# Patient Record
Sex: Female | Born: 1980 | Race: Black or African American | Hispanic: No | Marital: Single | Smoking: Never smoker
Health system: Southern US, Community
[De-identification: ages and names within clinical notes are randomized; demographics above are authoritative.]

## PROBLEM LIST (undated history)

## (undated) ENCOUNTER — Inpatient Hospital Stay (HOSPITAL_COMMUNITY): Payer: Self-pay

## (undated) DIAGNOSIS — IMO0002 Reserved for concepts with insufficient information to code with codable children: Secondary | ICD-10-CM

## (undated) DIAGNOSIS — D649 Anemia, unspecified: Secondary | ICD-10-CM

## (undated) HISTORY — PX: WISDOM TOOTH EXTRACTION: SHX21

## (undated) HISTORY — DX: Reserved for concepts with insufficient information to code with codable children: IMO0002

---

## 2003-08-10 ENCOUNTER — Emergency Department (HOSPITAL_COMMUNITY): Admission: EM | Admit: 2003-08-10 | Discharge: 2003-08-11 | Payer: Self-pay | Admitting: Emergency Medicine

## 2005-02-17 ENCOUNTER — Emergency Department (HOSPITAL_COMMUNITY): Admission: EM | Admit: 2005-02-17 | Discharge: 2005-02-17 | Payer: Self-pay | Admitting: Emergency Medicine

## 2005-07-12 ENCOUNTER — Inpatient Hospital Stay (HOSPITAL_COMMUNITY): Admission: AD | Admit: 2005-07-12 | Discharge: 2005-07-12 | Payer: Self-pay | Admitting: Obstetrics and Gynecology

## 2005-07-13 ENCOUNTER — Inpatient Hospital Stay (HOSPITAL_COMMUNITY): Admission: AD | Admit: 2005-07-13 | Discharge: 2005-07-13 | Payer: Self-pay | Admitting: Obstetrics and Gynecology

## 2005-09-13 ENCOUNTER — Inpatient Hospital Stay (HOSPITAL_COMMUNITY): Admission: AD | Admit: 2005-09-13 | Discharge: 2005-09-14 | Payer: Self-pay | Admitting: Obstetrics & Gynecology

## 2005-09-26 ENCOUNTER — Inpatient Hospital Stay (HOSPITAL_COMMUNITY): Admission: AD | Admit: 2005-09-26 | Discharge: 2005-09-26 | Payer: Self-pay | Admitting: Obstetrics & Gynecology

## 2005-09-26 ENCOUNTER — Inpatient Hospital Stay (HOSPITAL_COMMUNITY): Admission: AD | Admit: 2005-09-26 | Discharge: 2005-09-29 | Payer: Self-pay | Admitting: Obstetrics & Gynecology

## 2006-04-22 ENCOUNTER — Inpatient Hospital Stay (HOSPITAL_COMMUNITY): Admission: AD | Admit: 2006-04-22 | Discharge: 2006-04-22 | Payer: Self-pay | Admitting: Obstetrics and Gynecology

## 2006-11-15 IMAGING — US US OB COMP LESS 14 WK
1 series · 14 of 28 positions shown · non-contrast
Comparison: none

CLINICAL DATA: Positive urine pregnancy test. Right flank and back pain.

OB ULTRASOUND, LESS THAN 14 WEEKS, TRANSABDOMINAL AND TRANSVAGINAL:

[Series 1: unknown · 0.27mm/px · 14 of 47 slices shown]
[im 2/47]
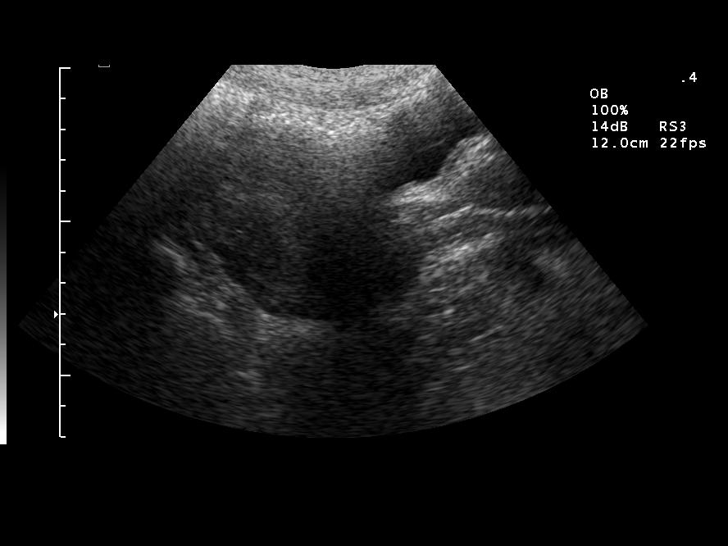
[im 6/47]
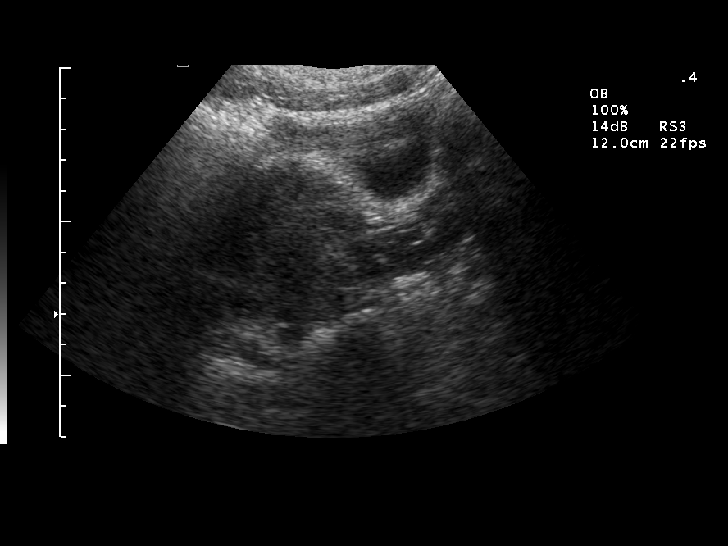
[im 9/47]
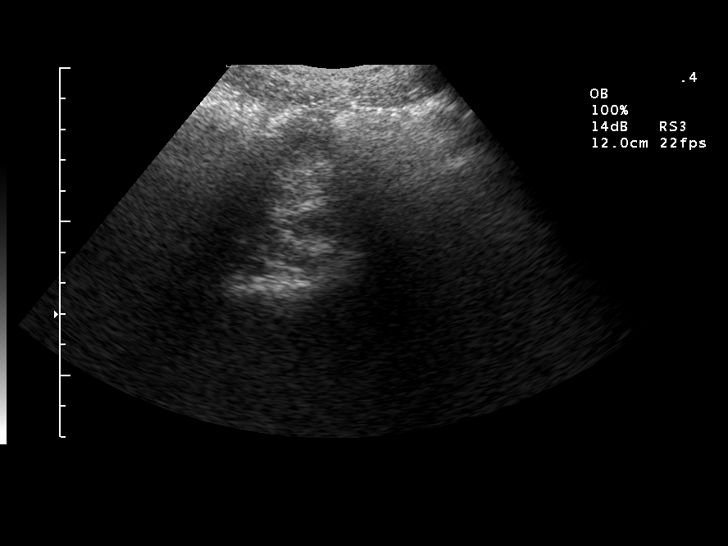
[im 12/47]
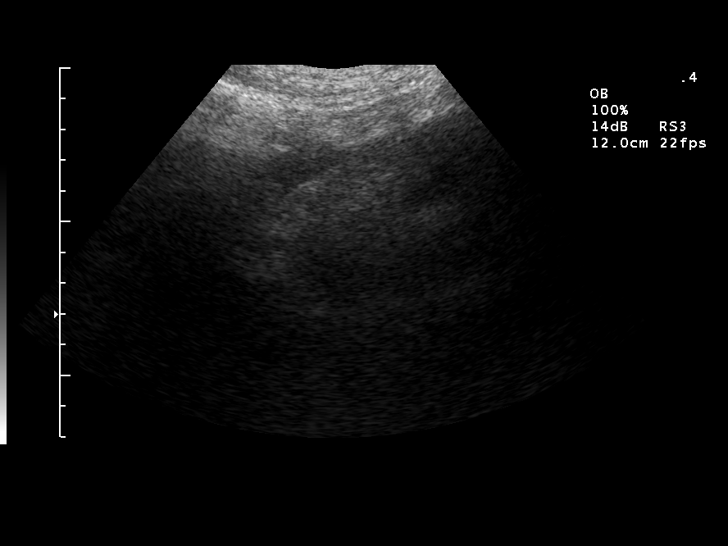
[im 16/47]
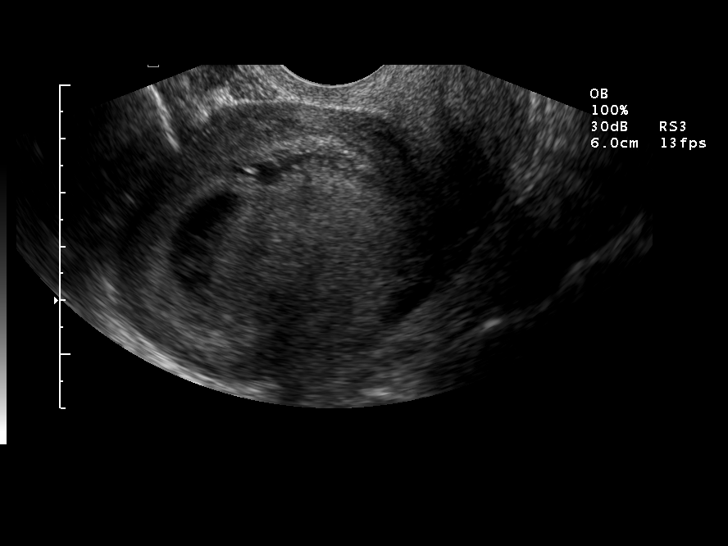
[im 19/47]
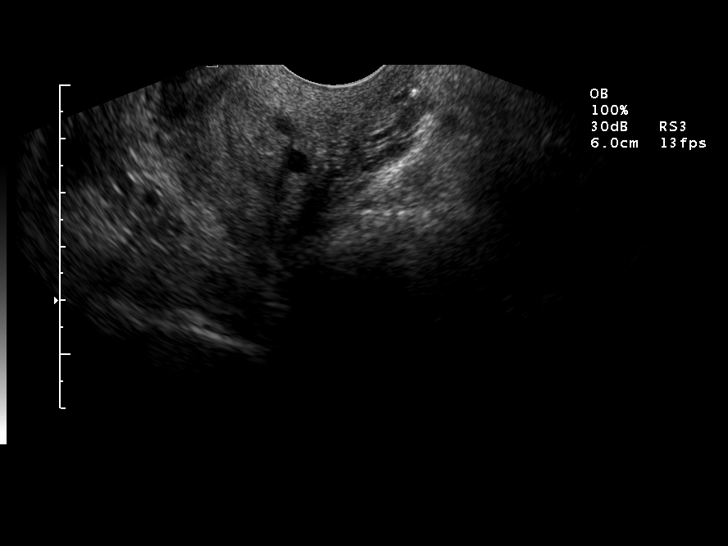
[im 23/47]
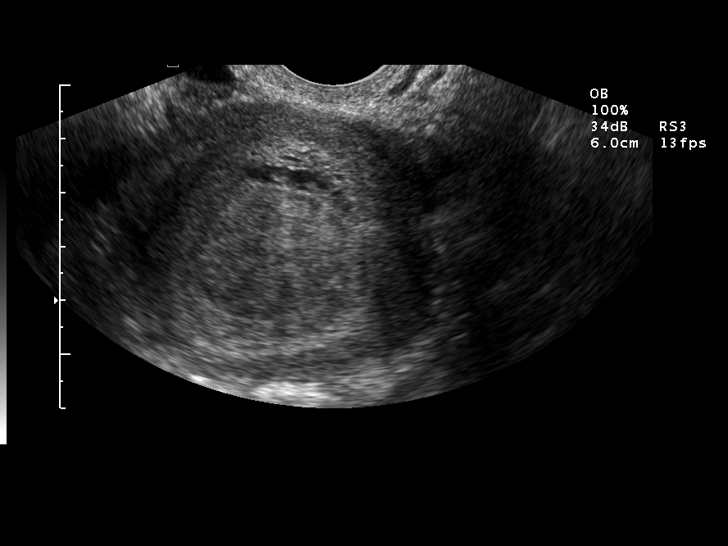
[im 26/47]
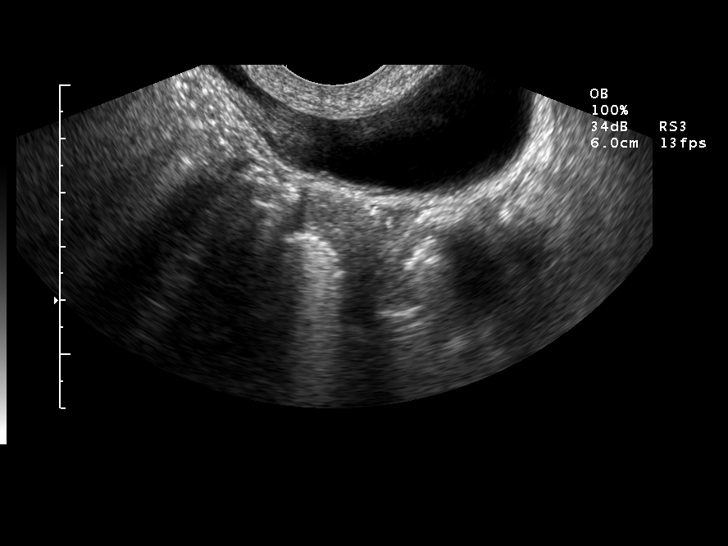
[im 29/47]
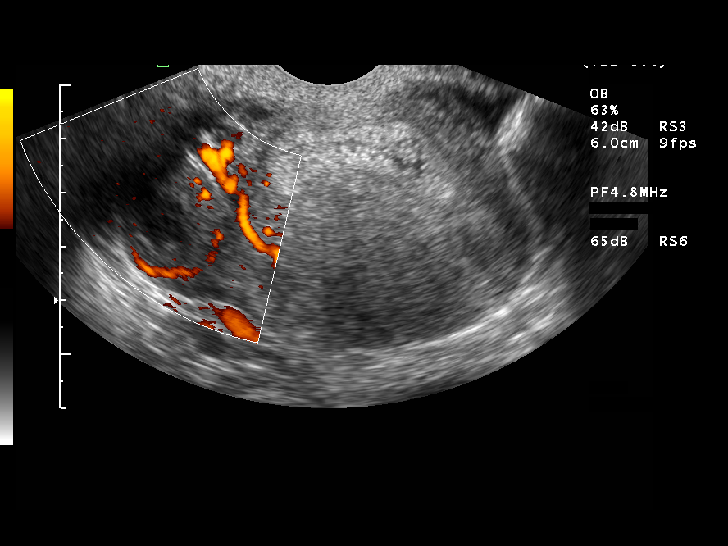
[im 33/47]
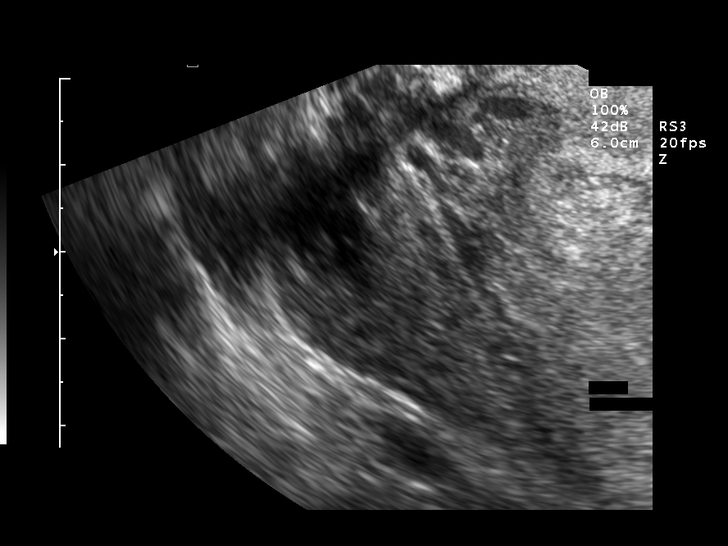
[im 36/47]
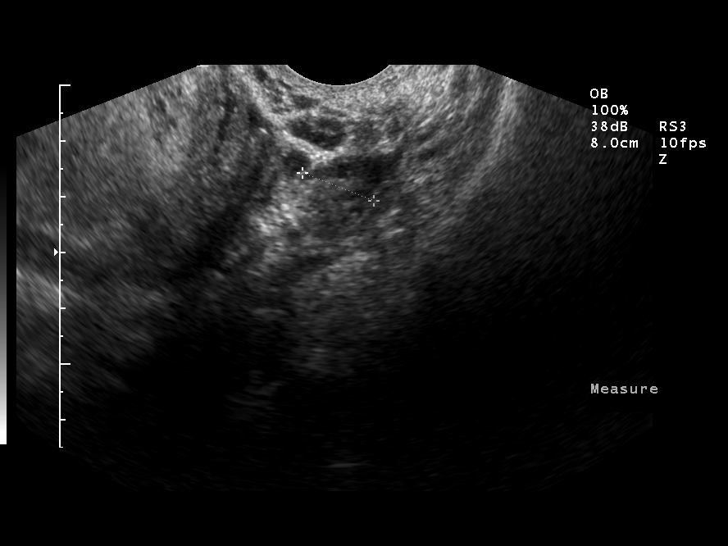
[im 40/47]
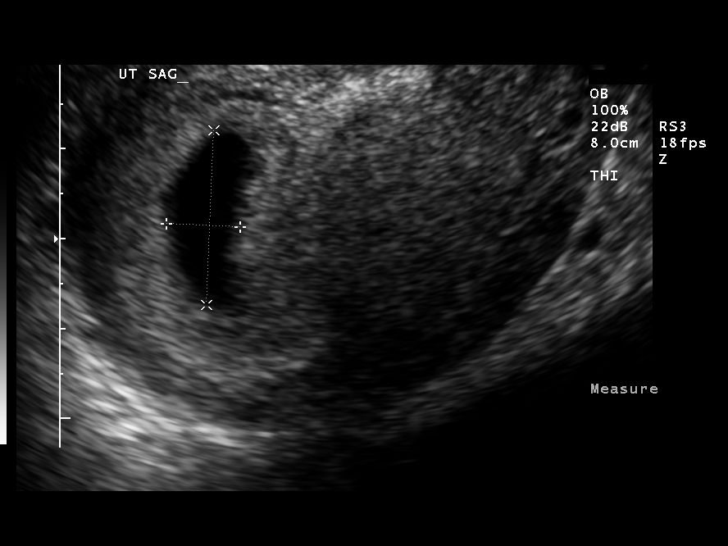
[im 43/47]
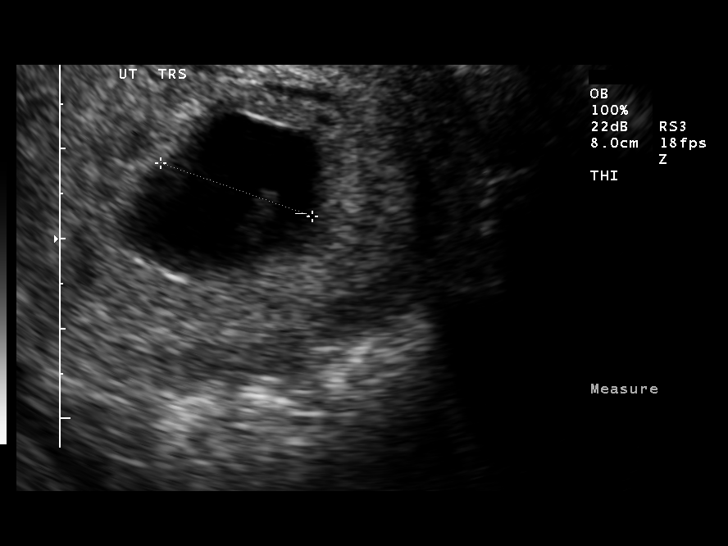
[im 47/47]
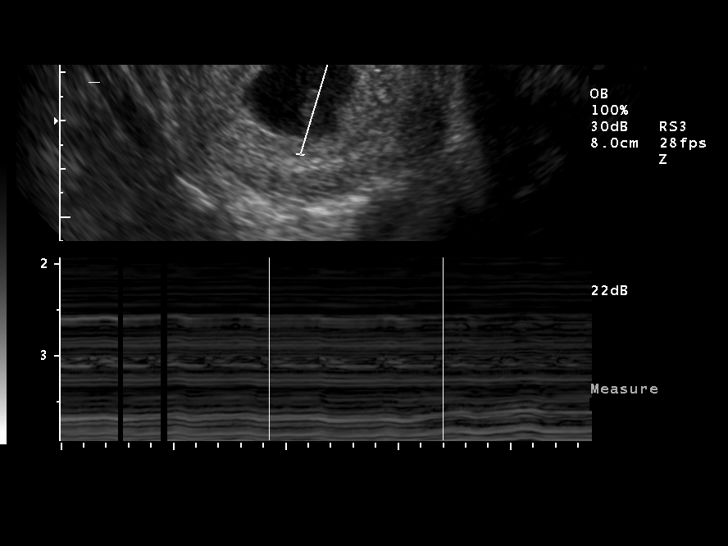

[14 of 28 positions shown; findings below may reference images not displayed]

FINDINGS: There is an intrauterine gestation. Yolk sac and fetal pole are
present. Based on crown-rump length, estimated gestational age 6 weeks 3 days.
Fetal heart rate 118 beats per minute. No subchorionic hemorrhage. Small corpus
luteum cyst noted in the right ovary. Left ovary unremarkable. Small amount of
free fluid.
IMPRESSION: Early intrauterine pregnancy, 6 weeks 3 days based on crown-rump length. Fetal
heart rate 118 beats per minute.

## 2006-12-04 ENCOUNTER — Inpatient Hospital Stay (HOSPITAL_COMMUNITY): Admission: AD | Admit: 2006-12-04 | Discharge: 2006-12-04 | Payer: Self-pay | Admitting: Obstetrics and Gynecology

## 2006-12-06 ENCOUNTER — Inpatient Hospital Stay (HOSPITAL_COMMUNITY): Admission: AD | Admit: 2006-12-06 | Discharge: 2006-12-08 | Payer: Self-pay | Admitting: Obstetrics and Gynecology

## 2007-01-22 ENCOUNTER — Other Ambulatory Visit: Admission: RE | Admit: 2007-01-22 | Discharge: 2007-01-22 | Payer: Self-pay | Admitting: Obstetrics and Gynecology

## 2008-08-29 ENCOUNTER — Inpatient Hospital Stay (HOSPITAL_COMMUNITY): Admission: AD | Admit: 2008-08-29 | Discharge: 2008-08-29 | Payer: Self-pay | Admitting: Obstetrics & Gynecology

## 2008-11-18 ENCOUNTER — Inpatient Hospital Stay (HOSPITAL_COMMUNITY): Admission: AD | Admit: 2008-11-18 | Discharge: 2008-11-18 | Payer: Self-pay | Admitting: Obstetrics & Gynecology

## 2008-11-23 ENCOUNTER — Inpatient Hospital Stay (HOSPITAL_COMMUNITY): Admission: AD | Admit: 2008-11-23 | Discharge: 2008-11-23 | Payer: Self-pay | Admitting: Obstetrics & Gynecology

## 2008-12-31 ENCOUNTER — Inpatient Hospital Stay (HOSPITAL_COMMUNITY): Admission: AD | Admit: 2008-12-31 | Discharge: 2009-01-01 | Payer: Self-pay | Admitting: Obstetrics and Gynecology

## 2009-01-07 ENCOUNTER — Inpatient Hospital Stay (HOSPITAL_COMMUNITY): Admission: AD | Admit: 2009-01-07 | Discharge: 2009-01-07 | Payer: Self-pay | Admitting: Obstetrics & Gynecology

## 2009-01-21 ENCOUNTER — Inpatient Hospital Stay (HOSPITAL_COMMUNITY): Admission: AD | Admit: 2009-01-21 | Discharge: 2009-01-21 | Payer: Self-pay | Admitting: Obstetrics & Gynecology

## 2009-01-31 ENCOUNTER — Inpatient Hospital Stay (HOSPITAL_COMMUNITY): Admission: AD | Admit: 2009-01-31 | Discharge: 2009-02-02 | Payer: Self-pay | Admitting: Obstetrics and Gynecology

## 2010-01-16 ENCOUNTER — Emergency Department (HOSPITAL_BASED_OUTPATIENT_CLINIC_OR_DEPARTMENT_OTHER): Admission: EM | Admit: 2010-01-16 | Discharge: 2010-01-16 | Payer: Self-pay | Admitting: Emergency Medicine

## 2010-09-28 IMAGING — US US FETAL BPP W/O NONSTRESS
1 series · 8 of 8 positions shown · non-contrast
Comparison: none

OBSTETRICAL ULTRASOUND:
 This ultrasound exam was performed in the [HOSPITAL] Ultrasound Department.  The OB US report was generated in the AS system, and faxed to the ordering physician.  This report is also available in [REDACTED] PACS.

[Series 1: us fetal bpp w/o nonstress · non-contrast · 0.33mm/px · 8 acquisitions, 8 frames shown]
[im 1/8]
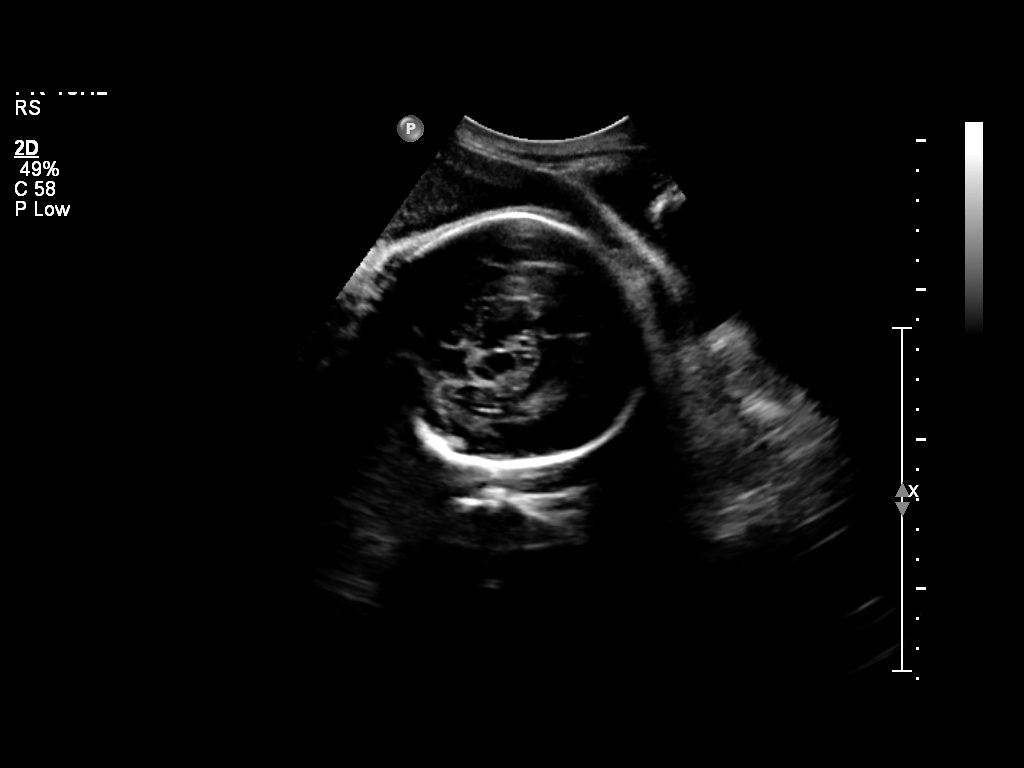
[im 2/8]
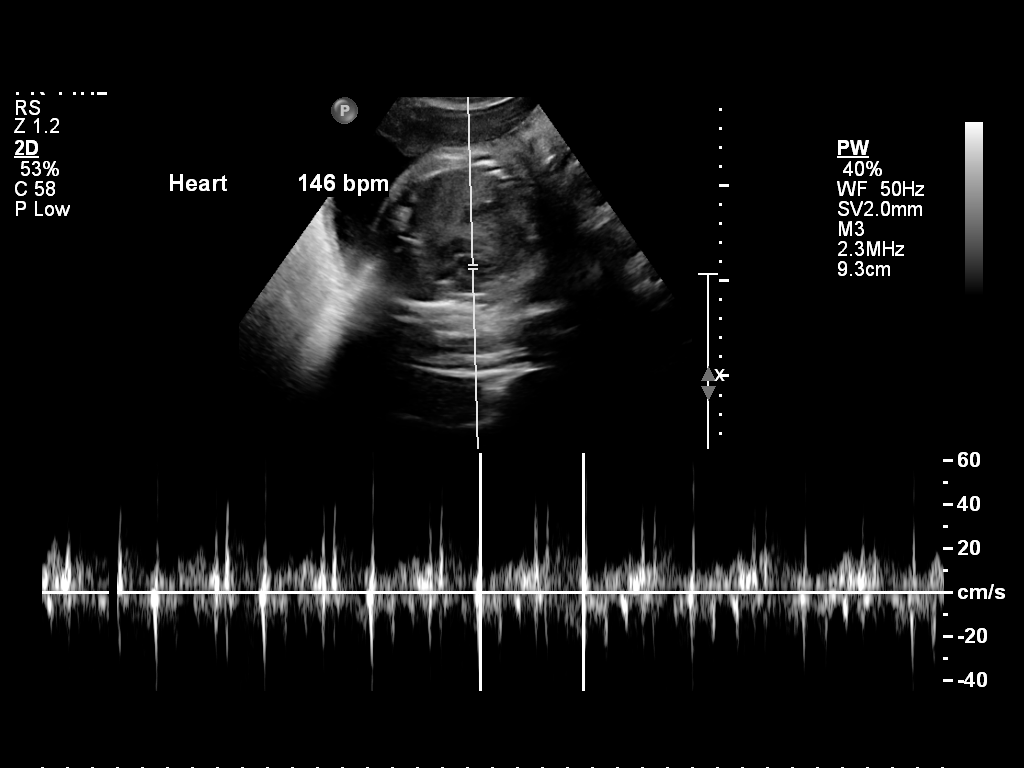
[im 3/8]
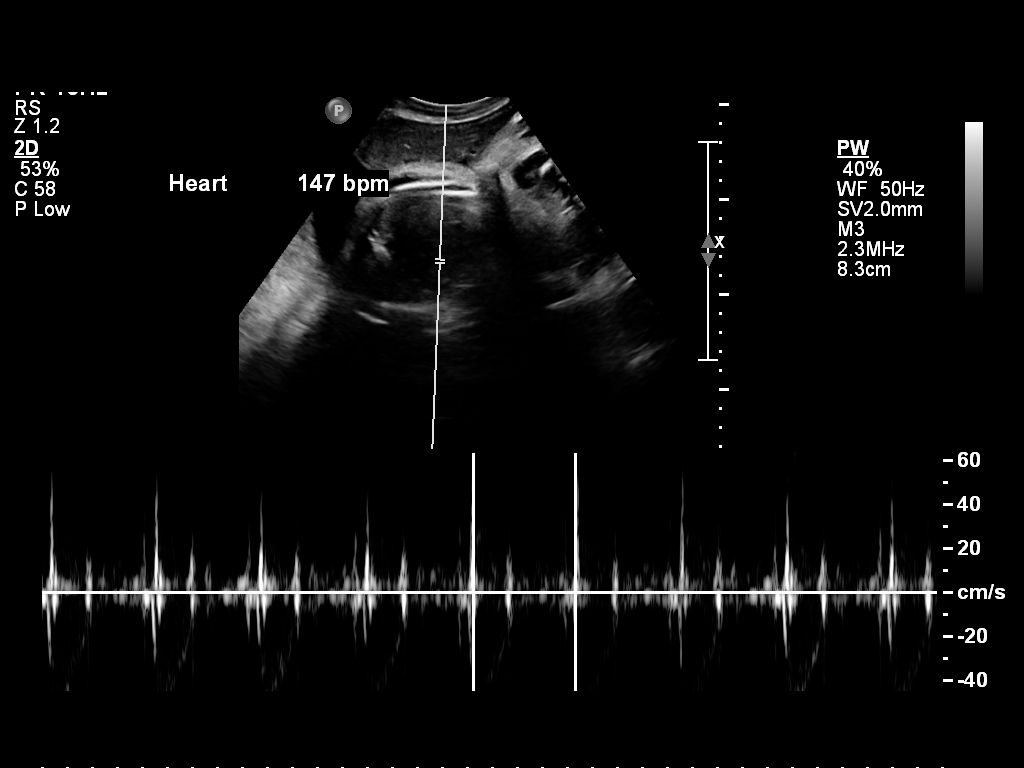
[im 4/8]
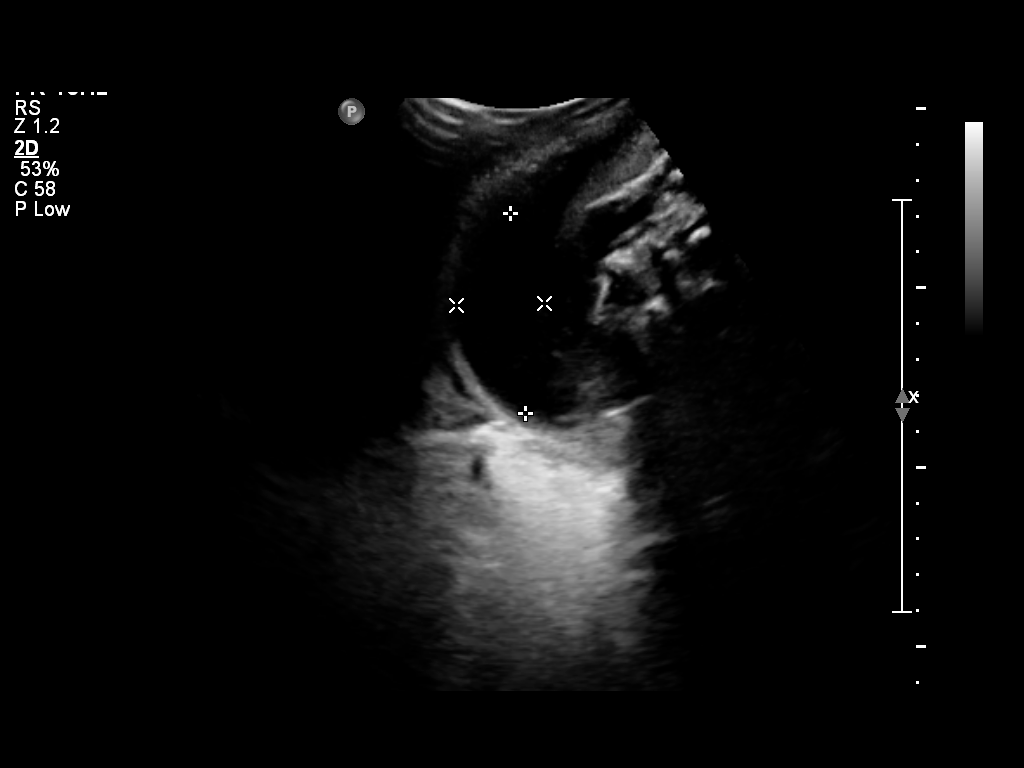
[im 5/8]
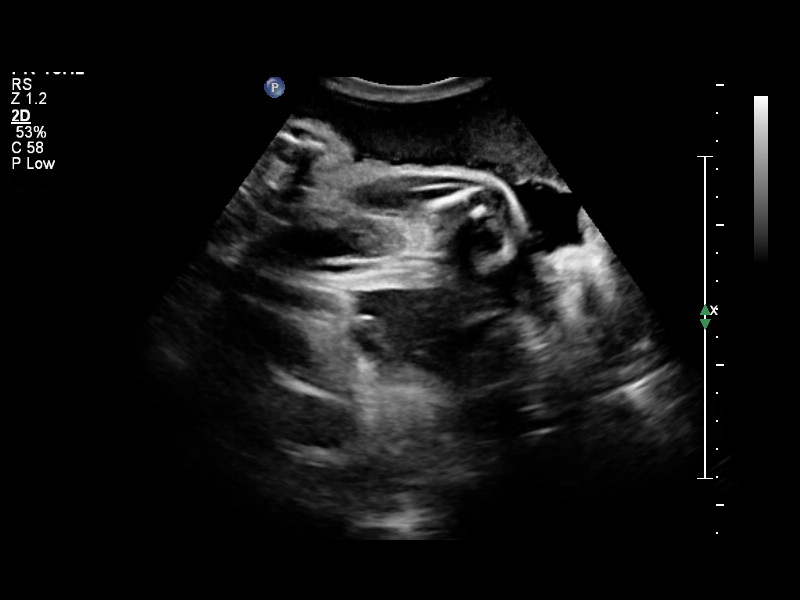
[im 6/8]
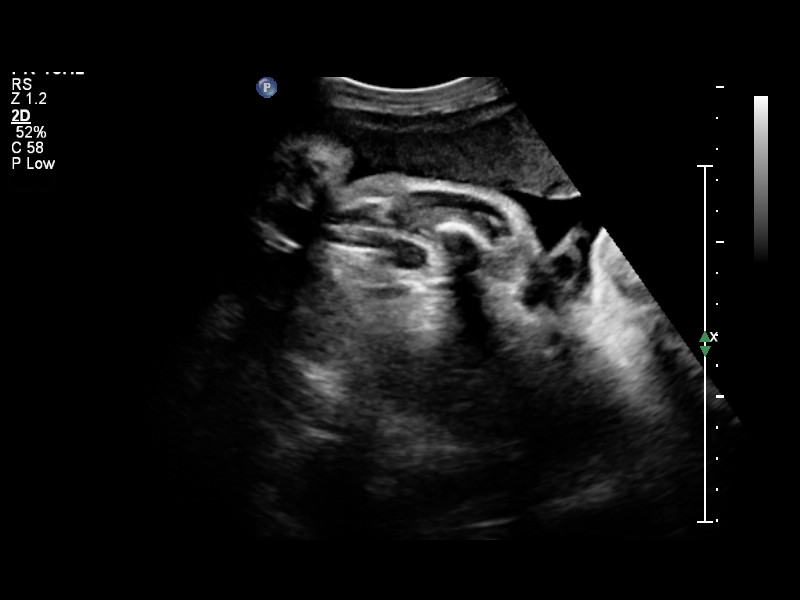
[im 7/8]
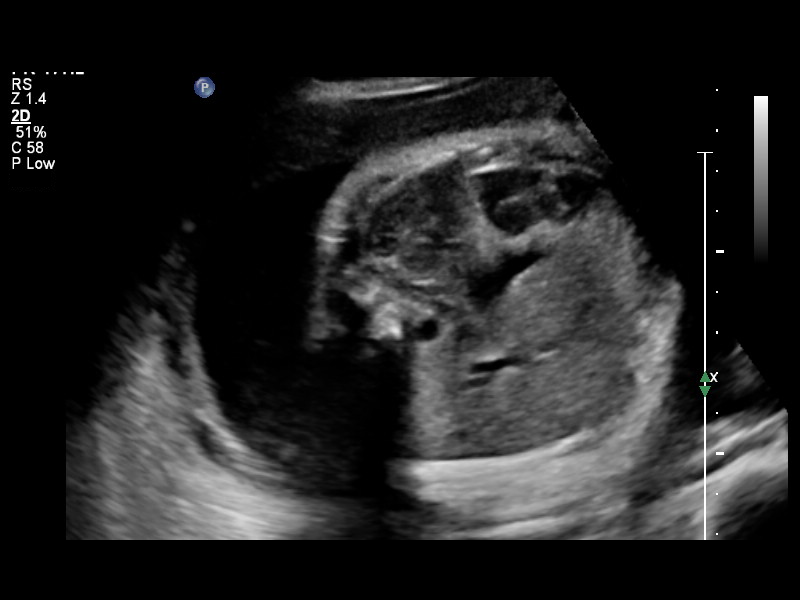
[im 8/8]
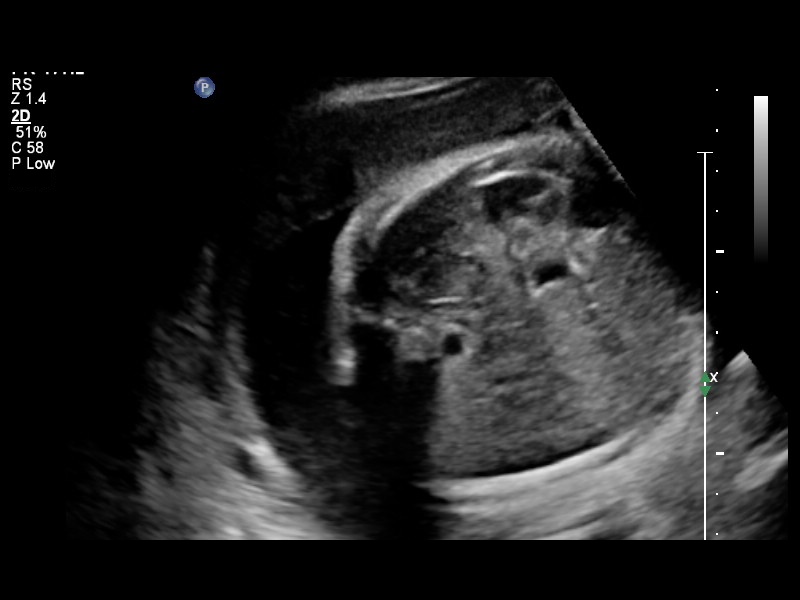

[8 of 8 positions shown; findings below may reference images not displayed]

IMPRESSION: See AS Obstetric US report.

## 2010-10-30 LAB — STREP A DNA PROBE: Group A Strep Probe: NEGATIVE

## 2010-11-12 DIAGNOSIS — IMO0002 Reserved for concepts with insufficient information to code with codable children: Secondary | ICD-10-CM

## 2010-11-12 DIAGNOSIS — R87619 Unspecified abnormal cytological findings in specimens from cervix uteri: Secondary | ICD-10-CM

## 2010-11-12 HISTORY — DX: Reserved for concepts with insufficient information to code with codable children: IMO0002

## 2010-11-12 HISTORY — DX: Unspecified abnormal cytological findings in specimens from cervix uteri: R87.619

## 2010-11-20 LAB — CBC
Hemoglobin: 12.1 g/dL (ref 12.0–15.0)
MCHC: 33.4 g/dL (ref 30.0–36.0)
MCHC: 34.1 g/dL (ref 30.0–36.0)
MCV: 95.3 fL (ref 78.0–100.0)
Platelets: 191 10*3/uL (ref 150–400)
WBC: 13.4 10*3/uL — ABNORMAL HIGH (ref 4.0–10.5)

## 2010-11-20 LAB — CCBB MATERNAL DONOR DRAW

## 2010-11-21 LAB — STREP B DNA PROBE: Strep Group B Ag: NEGATIVE

## 2010-11-22 LAB — URINALYSIS, ROUTINE W REFLEX MICROSCOPIC
Bilirubin Urine: NEGATIVE
Glucose, UA: NEGATIVE mg/dL
Ketones, ur: NEGATIVE mg/dL
Nitrite: NEGATIVE
Protein, ur: NEGATIVE mg/dL
pH: 5.5 (ref 5.0–8.0)

## 2010-11-22 LAB — FETAL FIBRONECTIN
Fetal Fibronectin: NEGATIVE
Fetal Fibronectin: NEGATIVE

## 2010-11-22 LAB — GC/CHLAMYDIA PROBE AMP, GENITAL
Chlamydia, DNA Probe: NEGATIVE
GC Probe Amp, Genital: NEGATIVE

## 2010-11-22 LAB — WET PREP, GENITAL: Yeast Wet Prep HPF POC: NONE SEEN

## 2010-11-27 LAB — CBC
MCV: 92.8 fL (ref 78.0–100.0)
Platelets: 247 10*3/uL (ref 150–400)
RBC: 3.47 MIL/uL — ABNORMAL LOW (ref 3.87–5.11)
WBC: 8.5 10*3/uL (ref 4.0–10.5)

## 2010-11-27 LAB — WET PREP, GENITAL: Trich, Wet Prep: NONE SEEN

## 2010-11-27 LAB — URINALYSIS, ROUTINE W REFLEX MICROSCOPIC
Bilirubin Urine: NEGATIVE
Hgb urine dipstick: NEGATIVE
Ketones, ur: 40 mg/dL — AB
Nitrite: NEGATIVE
Protein, ur: NEGATIVE mg/dL
Specific Gravity, Urine: <1.005 — ABNORMAL LOW (ref 1.005–1.030)
Urobilinogen, UA: 0.2 mg/dL (ref 0.0–1.0)
pH: 6.5 (ref 5.0–8.0)

## 2010-11-27 LAB — POCT PREGNANCY, URINE: Preg Test, Ur: POSITIVE

## 2010-11-27 LAB — GC/CHLAMYDIA PROBE AMP, GENITAL: Chlamydia, DNA Probe: NEGATIVE

## 2010-12-26 NOTE — H&P (Signed)
[name], [name] NO.:  [id number]   MEDICAL RECORD NO.:  [id number]          PATIENT TYPE:  INP   LOCATION:  9176                          FACILITY:  [facility]   PHYSICIAN:  Kendra H. Tenny Craw, MD     DATE OF BIRTH:  Jul 12, 1981   DATE OF ADMISSION:  01/31/2009  DATE OF DISCHARGE:                              HISTORY & PHYSICAL   CHIEF COMPLAINT:  Leaking fluid.   HISTORY OF PRESENT ILLNESS:  [name] is a 30 year old gravida 3, para  2-0-0-2, who presents at 30 weeks and 1 day estimated gestational age  with a chief complaint of leaking fluid.  She was confirmed to be  grossly ruptured in maternity admissions.  Her cervix at that time was  3, thick and -2.  She was not contracting uncomfortably at that point.  She was admitted to Labor and Delivery.  Upon admission, minimal  contractions were noted on the monitor and Pitocin augmentation was  initiated.  Currently, she is 5 cm, 80% effaced and -2 station.   PAST MEDICAL HISTORY:  Anemia.   PAST SURGICAL HISTORY:  None.   PAST OBSTETRICAL HISTORY:  Gravida 3, para 2-0-0-2.  1. In February 2007, she had a spontaneous vaginal delivery of a      female infant at 38 weeks weighing 6 pounds 9 ounces.  2. In April 2008, she had a spontaneous vaginal delivery of a female      infant at 39 weeks weighing 6 pounds 12 ounces.   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  A colposcopy was performed in the second trimester,  squamous metaplasia.  Minimal acetowhite change was noted on colposcopy  at 22 weeks.   MEDICATIONS:  1. Prenatal vitamins.  2. Claritin p.r.n.   ALLERGIES:  NO KNOWN DRUG ALLERGIES.   CURRENT PREGNANCY PRENATAL LABS:  Blood type A+, antibody screen  negative, sickle cell trait negative, RPR nonreactive, rubella immune,  hepatitis B surface antigen negative, HIV nonreactive.  Urine culture  negative.  Pap smear ASCUS positive high risk HPV.  Gonorrhea  and  Chlamydia were negative.  Maternal quad screen was performed at 17 weeks  and was within normal limits.  One-hour Glucola was 90, hemoglobin at 28  weeks was 10.6.  Group B strep is negative.  The current pregnancy is  complicated by late prenatal care.  Anatomic survey at 19 weeks was  normal without evidence of abnormality.   PHYSICAL EXAMINATION:  VITAL SIGNS:  Height 5 feet 2 inches tall, weight  140 pounds.  Blood pressure 98/60, temperature afebrile.  GENERAL:  Alert and oriented x3 in no apparent distress.  ABDOMEN:  Soft, nontender, nondistended.  No rebound or guarding.  Cervix was 3 cm thick, -2 on admission and she was grossly ruptured.  Fetal heart tones were 140s and reactive.   ASSESSMENT/PLAN:  This is a 30 year old gravida 3, para 2 at 38 weeks  and 1 day admitted for spontaneous rupture of membranes. Labor  augmentation will be performed as indicated.  Pt may have an epidural on  request.      Kendra H. Tenny Craw, MD  Electronically Signed     KHR/MEDQ  D:  01/31/2009  T:  01/31/2009  Job:  [id number]

## 2010-12-26 NOTE — Discharge Summary (Signed)
NAME:  [name], DEPOLO NO.:  [id number]   MEDICAL RECORD NO.:  [id number]          PATIENT TYPE:  INP   LOCATION:  9160                          FACILITY:  [facility]   PHYSICIAN:  Carrington Clamp, M.D. DATE OF BIRTH:  [birth year]   DATE OF ADMISSION:  12/31/2008  DATE OF DISCHARGE:  01/01/2009                               DISCHARGE SUMMARY   FINAL DIAGNOSES:  Intrauterine pregnancy at 33-5/7 weeks' gestation,  preterm uterine contractions.  Complications none.   This 30 year old G3, P 2-0-0-2 presents at 33-5/7 weeks' gestation  complaining of contractions.  The patient was seen in maternity  admissions.  Cervix was only fingertip, dilated, still high in the  pelvis.  She had a negative fetal fibronectin, but she still continued  to contract despite subcu n.p.o. terbutaline.  The patient was placed on  observation and started on Procardia every 6 hours.  By hospital day #1,  the patient was stable on oral Procardia.  She was contracting, maybe,  every 12 or 20 minutes or so.  Fetal heart tones were nice and reactive.  The patient was afebrile and felt ready for discharge.  The patient was  sent home, told to decrease activities, to continue her vitamins, her  Procardia 10 mg every 6 hours, was to follow up in our office on Monday,  of course to call with any increase in her contractions or any other  complications.   LABORATORY FINDINGS ON DISCHARGE:  The patient had an negative fetal  fibronectin and a negative group B strep culture.      Leilani Able, P.A.-C.      Carrington Clamp, M.D.  Electronically Signed    MB/MEDQ  D:  01/18/2009  T:  01/19/2009  Job:  [id number]

## 2010-12-29 NOTE — Discharge Summary (Signed)
NAME:  [name], [name] NO.:  [id number]   MEDICAL RECORD NO.:  [id number]          PATIENT TYPE:  INP   LOCATION:  9105                          FACILITY:  [facility]   PHYSICIAN:  James A. Ashley Royalty, M.D.DATE OF BIRTH:  12/19/1980   DATE OF ADMISSION:  12/06/2006  DATE OF DISCHARGE:  12/08/2006                               DISCHARGE SUMMARY   DISCHARGE DIAGNOSIS:  1. Intrauterine pregnancy at [redacted] weeks gestation, delivered.  2. History of diminished fetal movement.  3. Advanced cervical changes at term.   OPERATIONS AND PROCEDURES:  OB delivery with episiotomy and  episiorrhaphy.   CONSULTATIONS:  None.   DISCHARGE MEDICATIONS:  Percocet, Motrin 60 mg, prenatal vitamins.   HISTORY AND PHYSICAL:  30 year old gravida 3, para 1, abortus 1 at [redacted]  weeks gestation.  Prenatal care was complicated by diminished fetal  movement which was evaluated by serial nonstress tests which were  reactive.  The patient was admitted for induction secondary to same as  well as for advanced cervical changes at term.  Initial cervical  examination revealed the cervix to be 4-5 cm dilated, 80% effaced, minus  one station, vertex presentation.  Artificial rupture of membranes was  accomplished which revealed clear fluid.   HISTORY AND PHYSICAL:  Please see chart.   HOSPITAL COURSE:  The patient was admitted to [facility]  [place].  Initial laboratory studies were drawn.  She went on to  labor and deliver on December 06, 2006.  The infant was a 6 pounds 12  ounces female Apgars 5 at 1 minute and 9 at 5 minutes sent to newborn  nursery.  Arterial cord pH was 7.28.  Delivery was accomplished by Dr.  Sylvester Harder over second-degree midline episiotomy which was repaired  without difficulty.  The patient's postpartum course was benign.  She  was discharged on the second postpartum day afebrile and in satisfactory  condition.   DISPOSITION:  The patient is to return to [facility]  in 6 weeks for postpartum evaluation.      James A. Ashley Royalty, M.D.  Electronically Signed     JAM/MEDQ  D:  02/19/2007  T:  02/20/2007  Job:  [id number]

## 2012-03-19 ENCOUNTER — Emergency Department (HOSPITAL_COMMUNITY)
Admission: EM | Admit: 2012-03-19 | Discharge: 2012-03-19 | Disposition: A | Discharge status: Home / Self Care | Payer: Managed Care, Other (non HMO) | Attending: Emergency Medicine | Admitting: Emergency Medicine

## 2012-03-19 ENCOUNTER — Encounter (HOSPITAL_COMMUNITY): Payer: Self-pay | Admitting: Emergency Medicine

## 2012-03-19 DIAGNOSIS — M545 Low back pain, unspecified: Secondary | ICD-10-CM

## 2012-03-19 LAB — URINALYSIS, ROUTINE W REFLEX MICROSCOPIC
Bilirubin Urine: NEGATIVE
Leukocytes, UA: NEGATIVE
Nitrite: NEGATIVE
Specific Gravity, Urine: 1.016 (ref 1.005–1.030)
pH: 7.5 (ref 5.0–8.0)

## 2012-03-19 MED ORDER — CYCLOBENZAPRINE HCL 10 MG PO TABS: 10.0000 mg | ORAL_TABLET | Freq: Two times a day (BID) | ORAL | Status: AC | PRN

## 2012-03-19 MED ORDER — OXYCODONE-ACETAMINOPHEN 5-325 MG PO TABS: 1.0000 | ORAL_TABLET | ORAL | Status: AC | PRN

## 2012-03-19 NOTE — ED Notes (Signed)
Pt c/o lower back pain x 2 days; pt denies obvious injury 

## 2012-03-19 NOTE — ED Provider Notes (Signed)
History  This chart was scribed for Colleen Booze, MD by Shari Heritage. The patient was seen in room TR05C/TR05C. Patient's care was started at 0954.     CSN: [id number]  Arrival date & time 03/19/12  4098   First MD Initiated Contact with Patient 03/19/12 1115      Chief Complaint  Patient presents with  . Back Pain    (Consider location/radiation/quality/duration/timing/severity/associated sxs/prior treatment) The history is provided by the patient. No language interpreter was used.    REESE Ramsey is a 31 y.o. female who presents to the Emergency Department complaining of constant, moderate mid back pain onset 2 days ago. Patient rates the pain as 5/10. She says that it is more severe while walking and rates that pain as 8/10. She sometimes experiences a sudden intermittent sharp pain. Patient states that she took Aleve this morning to mild relief. Patient denies urinary or bowel incontinence. She reports no other pain or symptoms. Patient has no significant past medical, surgical or family history. She does not drink or smoke.  PCP - None  History  Substance Use Topics  . Smoking status: Never Smoker   . Smokeless tobacco: Not on file  . Alcohol Use: No    OB History    Grav Para Term Preterm Abortions TAB SAB Ect Mult Living                  Review of Systems  Gastrointestinal: Negative for diarrhea and constipation.  Genitourinary: Negative for difficulty urinating.  Musculoskeletal: Positive for back pain.  All other systems reviewed and are negative.    Allergies  Review of patient's allergies indicates no known allergies.  Home Medications   Current Outpatient Rx  Name Route Sig Dispense Refill  . NAPROXEN SODIUM 220 MG PO TABS Oral Take 440 mg by mouth daily as needed. For pain      BP 101/65  Pulse 84  Temp 98 F (36.7 C) (Oral)  Resp 16  SpO2 99%  Physical Exam  Constitutional: She is oriented to person, place, and time. She appears  well-developed and well-nourished.  HENT:  Head: Normocephalic and atraumatic.  Musculoskeletal:       Lumbar back: She exhibits tenderness.       Moderate tenderness to lumbar spine. Moderate para lumbar spasm with right greater than left. Negative straight leg raise.  Neurological: She is alert and oriented to person, place, and time.  Psychiatric: She has a normal mood and affect. Her behavior is normal.    ED Course  Procedures (including critical care time) DIAGNOSTIC STUDIES: Oxygen Saturation is 99% on room air, normal by my interpretation.    COORDINATION OF CARE: 11:23am- Patient informed of current plan for treatment and evaluation and agrees with plan at this time. Will discharge patient home with prescriptions for Flexeril and Percocet. Recommend that patient continue to take Aleve in future as needed.  Results for orders placed during the hospital encounter of 03/19/12  URINALYSIS, ROUTINE W REFLEX MICROSCOPIC      Component Value Range   Color, Urine YELLOW  YELLOW   APPearance CLEAR  CLEAR   Specific Gravity, Urine 1.016  1.005 - 1.030   pH 7.5  5.0 - 8.0   Glucose, UA NEGATIVE  NEGATIVE mg/dL   Hgb urine dipstick NEGATIVE  NEGATIVE   Bilirubin Urine NEGATIVE  NEGATIVE   Ketones, ur NEGATIVE  NEGATIVE mg/dL   Protein, ur NEGATIVE  NEGATIVE mg/dL   Urobilinogen, UA 0.2  0.0 - 1.0 mg/dL   Nitrite NEGATIVE  NEGATIVE   Leukocytes, UA NEGATIVE  NEGATIVE  PREGNANCY, URINE      Component Value Range   Preg Test, Ur NEGATIVE  NEGATIVE    1. Low back pain       MDM  Mid back pain without evidence of neurologic injury. She will be sent home with prescriptions for naproxen, cyclobenzaprine, and Percocet. No indication for imaging today.   I personally performed the services described in this documentation, which was scribed in my presence. The recorded information has been reviewed and considered.   Colleen Booze, MD 03/21/12 [phone]

## 2012-04-11 ENCOUNTER — Ambulatory Visit (INDEPENDENT_AMBULATORY_CARE_PROVIDER_SITE_OTHER): Payer: Private Health Insurance - Indemnity

## 2012-04-11 ENCOUNTER — Ambulatory Visit (INDEPENDENT_AMBULATORY_CARE_PROVIDER_SITE_OTHER): Payer: Private Health Insurance - Indemnity | Admitting: Obstetrics and Gynecology

## 2012-04-11 ENCOUNTER — Encounter: Payer: Self-pay | Admitting: Obstetrics and Gynecology

## 2012-04-11 VITALS — BP 90/66 | HR 92 | Ht 62.5 in | Wt 123.0 lb

## 2012-04-11 DIAGNOSIS — Z124 Encounter for screening for malignant neoplasm of cervix: Secondary | ICD-10-CM

## 2012-04-11 DIAGNOSIS — Z30432 Encounter for removal of intrauterine contraceptive device: Secondary | ICD-10-CM

## 2012-04-11 DIAGNOSIS — Z975 Presence of (intrauterine) contraceptive device: Secondary | ICD-10-CM

## 2012-04-11 MED ORDER — NORELGESTROMIN-ETH ESTRADIOL 150-35 MCG/24HR TD PTWK: 1.0000 | MEDICATED_PATCH | TRANSDERMAL | Status: DC

## 2012-04-11 MED ORDER — FLUCONAZOLE 150 MG PO TABS: 150.0000 mg | ORAL_TABLET | Freq: Once | ORAL | Status: AC

## 2012-04-11 MED ORDER — DOXYCYCLINE HYCLATE 50 MG PO CAPS: 100.0000 mg | ORAL_CAPSULE | Freq: Two times a day (BID) | ORAL | Status: AC

## 2012-04-11 NOTE — Progress Notes (Signed)
Last Pap: 11/2010 WNL: No Regular Periods:yes Contraception: mirena placed 03/2009  Monthly Breast exam:no Tetanus<61yrs:yes Nl.Bladder Function:yes Daily BMs:no Healthy Diet:yes Calcium:yes Mammogram:no Date of Mammogram: n/a Exercise:sometimes Have often Exercise:  Seatbelt: yes Abuse at home: no Stressful work:no Sigmoid-colonoscopy: n/a Bone Density: No PCP: none Change in PMH: n/a Change in FMH:n/a Pt would like IUD removed. She desires to use orth Evra instead.  She has used it in the past.  She denies any heart dz, GB dz or liver dz.  She denies any h/o of VTE Pt without complaints Physical Examination: General appearance - alert, well appearing, and in no distress Mental status - normal mood, behavior, speech, dress, motor activity, and thought processes Neck - supple, no significant adenopathy, thyroid exam: thyroid is normal in size without nodules or tenderness Chest - clear to auscultation, no wheezes, rales or rhonchi, symmetric air entry Heart - normal rate and regular rhythm Abdomen - soft, nontender, nondistended, no masses or organomegaly Breasts - breasts appear normal, no suspicious masses, no skin or nipple changes or axillary nodes Pelvic - normal external genitalia, vulva, vagina, cervix, uterus and adnexa  The string was well visualized Rectal - normal rectal, no masses, rectal exam not indicated Back exam - full range of motion, no tenderness, palpable spasm or pain on motion Neurological - alert, oriented, normal speech, no focal findings or movement disorder noted Musculoskeletal - no joint tenderness, deformity or swelling Extremities - no edema, redness or tenderness in the calves or thighs Skin - normal coloration and turgor, no rashes, no suspicious skin lesions noted Routine exam Retained Mirena Pap sent yes Mammogram due no when I pulled on the string of the mirena, it was very difficut to remove and painful for the pt.  the pt had an [place] whcich  demonstrated one pf the T bars was imbedded in the uterine wall.  I was able to remove the entire IUD under US guidance.   RT 1 yr.  Ortho evra rx sent to pharmacy Pt tolerated the procedure well.  She was given doxycyline rx as well

## 2012-04-11 NOTE — Patient Instructions (Signed)
Intrauterine Device Information An intrauterine device (IUD) is inserted into your uterus and prevents pregnancy. There are 2 types of IUDs available:  Copper IUD. This type of IUD is wrapped in copper wire and is placed inside the uterus. Copper makes the uterus and fallopian tubes produce a fluid that kills sperm. The copper IUD can stay in place for 10 years.   Hormone IUD. This type of IUD contains the hormone progestin (synthetic progesterone). The hormone thickens the cervical mucus and prevents sperm from entering the uterus, and it also thins the uterine lining to prevent implantation of a fertilized egg. The hormone can weaken or kill the sperm that get into the uterus. The hormone IUD can stay in place for 5 years.  Your caregiver will make sure you are a good candidate for a contraceptive IUD. Discuss with your caregiver the possible side effects. ADVANTAGES  It is highly effective, reversible, long-acting, and low maintenance.   There are no estrogen-related side effects.   An IUD can be used when breastfeeding.   It is not associated with weight gain.   It works immediately after insertion.   The copper IUD does not interfere with your female hormones.   The progesterone IUD can make heavy menstrual periods lighter.   The progesterone IUD can be used for 5 years.   The copper IUD can be used for 10 years.  DISADVANTAGES  The progesterone IUD can be associated with irregular bleeding patterns.   The copper IUD can make your menstrual flow heavier and more painful.   You may experience cramping and vaginal bleeding after insertion.  Document Released: 07/03/2004 Document Revised: 07/19/2011 Document Reviewed: 12/02/2010 [employer] Patient Information 2012 [employer]. 

## 2012-04-15 LAB — PAP IG W/ RFLX HPV ASCU

## 2012-12-26 ENCOUNTER — Emergency Department (HOSPITAL_COMMUNITY)
Admission: EM | Admit: 2012-12-26 | Discharge: 2012-12-26 | Disposition: A | Discharge status: Home / Self Care | Payer: Medicaid Other | Attending: Emergency Medicine | Admitting: Emergency Medicine

## 2012-12-26 ENCOUNTER — Encounter (HOSPITAL_COMMUNITY): Payer: Self-pay | Admitting: Emergency Medicine

## 2012-12-26 DIAGNOSIS — K0889 Other specified disorders of teeth and supporting structures: Secondary | ICD-10-CM

## 2012-12-26 DIAGNOSIS — Z3201 Encounter for pregnancy test, result positive: Secondary | ICD-10-CM | POA: Insufficient documentation

## 2012-12-26 DIAGNOSIS — K089 Disorder of teeth and supporting structures, unspecified: Secondary | ICD-10-CM | POA: Insufficient documentation

## 2012-12-26 DIAGNOSIS — Z331 Pregnant state, incidental: Secondary | ICD-10-CM | POA: Insufficient documentation

## 2012-12-26 LAB — POCT PREGNANCY, URINE: Preg Test, Ur: POSITIVE — AB

## 2012-12-26 MED ORDER — PENICILLIN V POTASSIUM 500 MG PO TABS: 500.0000 mg | ORAL_TABLET | Freq: Four times a day (QID) | ORAL | Status: AC

## 2012-12-26 MED ORDER — PRENATAL COMPLETE 14-0.4 MG PO TABS: 1.0000 | ORAL_TABLET | Freq: Every day | ORAL | Status: DC

## 2012-12-26 NOTE — ED Provider Notes (Signed)
History     CSN: [id number]  Arrival date & time 12/26/12  0905   First MD Initiated Contact with Patient 12/26/12 [phone]      Chief Complaint  Patient presents with  . Dental Pain    (Consider location/radiation/quality/duration/timing/severity/associated sxs/prior treatment) HPI Comments: Patient reports she broke her right lower 1st molar in October, has had pain intermittently since then but much increased pain over the past two days.  Pain radiates into her right ear and right face.  Has been taking tylenol without relief.  Pain is 9/10, worse with cold air and eating.  Denies fevers, chills, myalgias, sore throat, difficulty swallowing or breathing.   Pt is concerned with taking medications because she thinks she may be pregnant.  LMP March.   Patient is a 32 y.o. female presenting with tooth pain. The history is provided by the patient.  Dental PainPrimary symptoms do not include fever, shortness of breath, sore throat or cough.  Additional symptoms do not include: trouble swallowing.    Past Medical History  Diagnosis Date  . Abnormal Pap smear 11/2010    Past Surgical History  Procedure Laterality Date  . Wisdom tooth extraction      Family History  Problem Relation Age of Onset  . Asthma Mother     History  Substance Use Topics  . Smoking status: Never Smoker   . Smokeless tobacco: Not on file  . Alcohol Use: No    OB History   Grav Para Term Preterm Abortions TAB SAB Ect Mult Living   3 3        3       Review of Systems  Constitutional: Negative for fever and chills.  HENT: Positive for dental problem. Negative for congestion, sore throat, trouble swallowing and sinus pressure.   Respiratory: Negative for cough and shortness of breath.   Musculoskeletal: Negative for myalgias.    Allergies  Review of patient's allergies indicates no known allergies.  Home Medications   Current Outpatient Rx  Name  Route  Sig  Dispense  Refill  . acetaminophen  (TYLENOL) 500 MG tablet   Oral   Take 1,000 mg by mouth every 6 (six) hours as needed for pain.           BP 106/61  Pulse 104  Temp(Src) 99.1 F (37.3 C) (Oral)  Resp 18  SpO2 100%  Physical Exam  Nursing note and vitals reviewed. Constitutional: She appears well-developed and well-nourished. No distress.  HENT:  Head: Normocephalic and atraumatic. No trismus in the jaw.  Mouth/Throat: Uvula is midline and oropharynx is clear and moist. Mucous membranes are not dry. No edematous. No oropharyngeal exudate, posterior oropharyngeal edema, posterior oropharyngeal erythema or tonsillar abscesses.    Neck: Normal range of motion and phonation normal. Neck supple. No tracheal tenderness present. No tracheal deviation present.  Pulmonary/Chest: Effort normal. No stridor.  Lymphadenopathy:    She has no cervical adenopathy.  Neurological: She is alert.  Skin: She is not diaphoretic.    ED Course  Procedures (including critical care time)  Labs Reviewed  POCT PREGNANCY, URINE - Abnormal; Notable for the following:    Preg Test, Ur POSITIVE (*)    All other components within normal limits   No results found.   1. Pain, dental   2. Pregnancy as incidental finding     MDM  Pt with dental pain x 2 days.  Afebrile, nontoxic.  No obvious abscess but significant increase in pain over  two days.  Pregnancy test is positive.  Pt denies abdominal pain, abnormal vaginal discharge or bleeding.  Pt d/c home with dental and OB follow up, penicillin for possible dental abscess.  Discussed all results, findings, treatment plan, and follow up with patient. Discussed safety of medications in pregnancy. Pt given return precautions.  Pt verbalizes understanding and agrees with plan.     I doubt any other EMC precluding discharge at this time including, but not necessarily limited to the following: deep space neck infection, ludwig's angina, ruptured ectopic pregnancy        Trixie Dredge,  PA-C 12/26/12 1423

## 2012-12-26 NOTE — ED Notes (Signed)
Patient has a history of a cracked tooth right lower jaw and recently developed dental pain radiating to right side of face. Airway intact bilateral equal chest rise and fall.

## 2012-12-27 NOTE — ED Provider Notes (Signed)
Medical screening examination/treatment/procedure(s) were performed by non-physician practitioner and as supervising physician I was immediately available for consultation/collaboration.   Laray Anger, DO 12/27/12 1118

## 2013-01-15 ENCOUNTER — Encounter: Payer: Self-pay | Admitting: Obstetrics

## 2013-01-26 ENCOUNTER — Inpatient Hospital Stay (HOSPITAL_COMMUNITY)
Admission: AD | Admit: 2013-01-26 | Discharge: 2013-01-27 | Disposition: A | Discharge status: Home / Self Care | Payer: Medicaid Other | Source: Ambulatory Visit | Attending: Obstetrics & Gynecology | Admitting: Obstetrics & Gynecology

## 2013-01-26 ENCOUNTER — Encounter (HOSPITAL_COMMUNITY): Payer: Self-pay | Admitting: *Deleted

## 2013-01-26 DIAGNOSIS — O26859 Spotting complicating pregnancy, unspecified trimester: Secondary | ICD-10-CM | POA: Insufficient documentation

## 2013-01-26 DIAGNOSIS — N949 Unspecified condition associated with female genital organs and menstrual cycle: Secondary | ICD-10-CM

## 2013-01-26 DIAGNOSIS — O99891 Other specified diseases and conditions complicating pregnancy: Secondary | ICD-10-CM | POA: Insufficient documentation

## 2013-01-26 DIAGNOSIS — R102 Pelvic and perineal pain: Secondary | ICD-10-CM

## 2013-01-26 DIAGNOSIS — O9989 Other specified diseases and conditions complicating pregnancy, childbirth and the puerperium: Secondary | ICD-10-CM

## 2013-01-26 DIAGNOSIS — R109 Unspecified abdominal pain: Secondary | ICD-10-CM | POA: Insufficient documentation

## 2013-01-26 LAB — WET PREP, GENITAL
Trich, Wet Prep: NONE SEEN
Yeast Wet Prep HPF POC: NONE SEEN

## 2013-01-26 LAB — URINALYSIS, ROUTINE W REFLEX MICROSCOPIC
Bilirubin Urine: NEGATIVE
Glucose, UA: NEGATIVE mg/dL
Hgb urine dipstick: NEGATIVE
Ketones, ur: NEGATIVE mg/dL
pH: 6 (ref 5.0–8.0)

## 2013-01-26 LAB — CBC
HCT: 29 % — ABNORMAL LOW (ref 36.0–46.0)
Hemoglobin: 9.5 g/dL — ABNORMAL LOW (ref 12.0–15.0)
MCH: 29.2 pg (ref 26.0–34.0)
MCHC: 32.8 g/dL (ref 30.0–36.0)
MCV: 89.2 fL (ref 78.0–100.0)

## 2013-01-26 MED ORDER — FLUCONAZOLE 150 MG PO TABS: 150.0000 mg | ORAL_TABLET | Freq: Once | ORAL | Status: DC

## 2013-01-26 NOTE — MAU Provider Note (Signed)
History     CSN: [id number]  Arrival date and time: 01/26/13 2149   None     Chief Complaint  Patient presents with  . Vaginal Bleeding  . Dysmenorrhea   HPI  [name] is a 32 y.o. G4P3003 at [redacted]w[redacted]d who presents today with lower abdominal pain, and she had some spotting while wiping today. She has not started [facility] at this time. She is planning on going to [facility].   Past Medical History  Diagnosis Date  . Abnormal Pap smear 11/2010    Past Surgical History  Procedure Laterality Date  . Wisdom tooth extraction      Family History  Problem Relation Age of Onset  . Asthma Mother     History  Substance Use Topics  . Smoking status: Never Smoker   . Smokeless tobacco: Not on file  . Alcohol Use: No    Allergies: No Known Allergies  Prescriptions prior to admission  Medication Sig Dispense Refill  . acetaminophen (TYLENOL) 500 MG tablet Take 1,000 mg by mouth every 6 (six) hours as needed for pain.      . Prenatal Vit-Fe Fumarate-FA (PRENATAL COMPLETE) 14-0.4 MG TABS Take 1 tablet by mouth daily.  60 each  5    Review of Systems  Constitutional: Negative for fever.  Eyes: Negative for blurred vision.  Respiratory: Negative for shortness of breath.   Cardiovascular: Negative for chest pain.  Gastrointestinal: Positive for abdominal pain (Points to the lower abdomen on both sides (the area of the round ligaments). Negative for nausea, vomiting, diarrhea and constipation.  Genitourinary: Negative for dysuria, urgency and frequency.  Musculoskeletal: Negative for myalgias.  Neurological: Negative for dizziness and headaches.   Physical Exam   Blood pressure 111/65, pulse 95, temperature 99.1 F (37.3 C), temperature source Oral, resp. rate 20, height 5\' 3"  (1.6 m), weight 53.978 kg (119 lb), last menstrual period 10/27/2012, SpO2 100.00%.  Physical Exam  Nursing note and vitals reviewed. Constitutional: She is oriented to person, place, and time. She  appears well-developed and well-nourished. No distress.  Cardiovascular: Normal rate.   Respiratory: Effort normal.  GI: Soft. She exhibits no distension. There is no tenderness.  Genitourinary:   External: no lesion Vagina: small amount of white discharge, no blood seen Cervix: pink, friable, small amount of blood after collecting GC/CT sample Uterus: AGA   Neurological: She is alert and oriented to person, place, and time.  Skin: Skin is warm and dry.  Psychiatric: She has a normal mood and affect.    MAU Course  Procedures  Results for orders placed during the hospital encounter of 01/26/13 (from the past 24 hour(s))  CBC     Status: Abnormal   Collection Time    01/26/13 10:10 PM      Result Value Range   WBC 8.1  4.0 - 10.5 K/uL   RBC 3.25 (*) 3.87 - 5.11 MIL/uL   Hemoglobin 9.5 (*) 12.0 - 15.0 g/dL   HCT 40.9 (*) 81.1 - 91.4 %   MCV 89.2  78.0 - 100.0 fL   MCH 29.2  26.0 - 34.0 pg   MCHC 32.8  30.0 - 36.0 g/dL   RDW 78.2  95.6 - 21.3 %   Platelets 231  150 - 400 K/uL  HCG, QUANTITATIVE, PREGNANCY     Status: Abnormal   Collection Time    01/26/13 10:10 PM      Result Value Range   hCG, Beta Chain, Quant, S  191478 (*) <5 mIU/mL  URINALYSIS, ROUTINE W REFLEX MICROSCOPIC     Status: Abnormal   Collection Time    01/26/13 10:30 PM      Result Value Range   Color, Urine YELLOW  YELLOW   APPearance CLEAR  CLEAR   Specific Gravity, Urine <1.005 (*) 1.005 - 1.030   pH 6.0  5.0 - 8.0   Glucose, UA NEGATIVE  NEGATIVE mg/dL   Hgb urine dipstick NEGATIVE  NEGATIVE   Bilirubin Urine NEGATIVE  NEGATIVE   Ketones, ur NEGATIVE  NEGATIVE mg/dL   Protein, ur NEGATIVE  NEGATIVE mg/dL   Urobilinogen, UA 0.2  0.0 - 1.0 mg/dL   Nitrite NEGATIVE  NEGATIVE   Leukocytes, UA TRACE (*) NEGATIVE  URINE MICROSCOPIC-ADD ON     Status: None   Collection Time    01/26/13 10:30 PM      Result Value Range   Squamous Epithelial / LPF RARE  RARE   WBC, UA 0-2  <3 WBC/hpf   Bacteria, UA  RARE  RARE  WET PREP, GENITAL     Status: Abnormal   Collection Time    01/26/13 11:00 PM      Result Value Range   Yeast Wet Prep HPF POC NONE SEEN  NONE SEEN   Trich, Wet Prep NONE SEEN  NONE SEEN   Clue Cells Wet Prep HPF POC FEW (*) NONE SEEN   WBC, Wet Prep HPF POC MANY (*) NONE SEEN     Assessment and Plan   1. Pain of round ligament complicating pregnancy, antepartum    Start [facility] as soon as possible 2dn trimester danger signs reviewed  Tawnya Crook 01/26/2013, 11:15 PM

## 2013-01-26 NOTE — MAU Note (Signed)
Started with a vaginal irritation, with a thin yellow discharge three days ago; started with pink spotting only when she wipes and intermittent, bilat, low cramping today.  Denies any urinary difficulties.

## 2013-01-26 NOTE — MAU Note (Signed)
Pt states she has been having a "little" cramping and some spotting today. Also reports vaginal irritation.

## 2013-01-27 LAB — GC/CHLAMYDIA PROBE AMP: CT Probe RNA: NEGATIVE

## 2013-01-28 ENCOUNTER — Other Ambulatory Visit: Payer: Self-pay | Admitting: Advanced Practice Midwife

## 2013-01-28 NOTE — MAU Provider Note (Signed)
Unable to edit noted from 01/26/13. FHT ausculted by RN during MAU visit. FHR: 150's Tawnya Crook

## 2013-01-28 NOTE — MAU Provider Note (Signed)
Attestation of Attending Supervision of Advanced Practitioner (CNM/NP): Evaluation and management procedures were performed by the Advanced Practitioner under my supervision and collaboration.  I have reviewed the Advanced Practitioner's note and chart, and I agree with the management and plan.  HARRAWAY-SMITH, Cristiano Capri 3:48 PM     

## 2013-02-24 LAB — OB RESULTS CONSOLE RUBELLA ANTIBODY, IGM: Rubella: IMMUNE

## 2013-02-24 LAB — OB RESULTS CONSOLE HEPATITIS B SURFACE ANTIGEN: Hepatitis B Surface Ag: NEGATIVE

## 2013-02-24 LAB — OB RESULTS CONSOLE HIV ANTIBODY (ROUTINE TESTING): HIV: NONREACTIVE

## 2013-02-24 LAB — OB RESULTS CONSOLE RPR: RPR: NONREACTIVE

## 2013-02-24 LAB — OB RESULTS CONSOLE ABO/RH: RH Type: POSITIVE

## 2013-04-08 ENCOUNTER — Ambulatory Visit: Payer: Self-pay

## 2013-06-10 ENCOUNTER — Inpatient Hospital Stay (HOSPITAL_COMMUNITY)
Admission: AD | Admit: 2013-06-10 | Discharge: 2013-06-10 | Disposition: A | Discharge status: Home / Self Care | Payer: Medicaid Other | Source: Ambulatory Visit | Attending: Obstetrics and Gynecology | Admitting: Obstetrics and Gynecology

## 2013-06-10 ENCOUNTER — Encounter (HOSPITAL_COMMUNITY): Payer: Self-pay | Admitting: *Deleted

## 2013-06-10 ENCOUNTER — Inpatient Hospital Stay (HOSPITAL_COMMUNITY): Payer: Medicaid Other

## 2013-06-10 DIAGNOSIS — O4700 False labor before 37 completed weeks of gestation, unspecified trimester: Secondary | ICD-10-CM | POA: Insufficient documentation

## 2013-06-10 HISTORY — DX: Anemia, unspecified: D64.9

## 2013-06-10 LAB — URINALYSIS, ROUTINE W REFLEX MICROSCOPIC
Bilirubin Urine: NEGATIVE
Glucose, UA: NEGATIVE mg/dL
Hgb urine dipstick: NEGATIVE
Protein, ur: NEGATIVE mg/dL
Specific Gravity, Urine: <1.005 — ABNORMAL LOW (ref 1.005–1.030)
Urobilinogen, UA: 0.2 mg/dL (ref 0.0–1.0)

## 2013-06-10 LAB — GC/CHLAMYDIA PROBE AMP: GC Probe RNA: NEGATIVE

## 2013-06-10 LAB — WET PREP, GENITAL
Clue Cells Wet Prep HPF POC: NONE SEEN
Trich, Wet Prep: NONE SEEN

## 2013-06-10 MED ORDER — NIFEDIPINE 10 MG PO CAPS
10.0000 mg | ORAL_CAPSULE | ORAL | Status: DC | PRN
  Administered 2013-06-10 (×2): 10 mg via ORAL
  Filled 2013-06-10 (×2): qty 1

## 2013-06-10 NOTE — MAU Provider Note (Signed)
  History     CSN: [id number]  Arrival date and time: 06/10/13 4098   None     Chief Complaint  Patient presents with  . Contractions   HPI Comments: Pt is a G4P3 at [redacted]w[redacted]d that arrives unannounced w c/o ctx that started about 8pm, states they are irregular, but have been persistent despite having drank extra water this evening. She denies any VB or LOF, reports GFM. Denies any pregnancy complications or previous preterm labor or birth. Reports having IC yesterday around this time, denies any UTI sx's      Past Medical History  Diagnosis Date  . Abnormal Pap smear 11/2010  . Anemia     Past Surgical History  Procedure Laterality Date  . Wisdom tooth extraction      Family History  Problem Relation Age of Onset  . Asthma Mother     History  Substance Use Topics  . Smoking status: Never Smoker   . Smokeless tobacco: Not on file  . Alcohol Use: No    Allergies: No Known Allergies  Prescriptions prior to admission  Medication Sig Dispense Refill  . acetaminophen (TYLENOL) 500 MG tablet Take 1,000 mg by mouth every 6 (six) hours as needed for pain.      . ferrous sulfate 325 (65 FE) MG tablet Take 325 mg by mouth daily with breakfast.      . Prenatal Vit-Fe Fumarate-FA (PRENATAL COMPLETE) 14-0.4 MG TABS Take 1 tablet by mouth daily.  60 each  5  . fluconazole (DIFLUCAN) 150 MG tablet Take 1 tablet (150 mg total) by mouth once.  1 tablet  1    Review of Systems  All other systems reviewed and are negative.   Physical Exam   Blood pressure 102/64, pulse 93, temperature 98.1 F (36.7 C), temperature source Oral, resp. rate 16, height 5\' 3"  (1.6 m), weight 144 lb (65.318 kg), last menstrual period 10/27/2012, SpO2 99.00%.  Physical Exam  Nursing note and vitals reviewed. Constitutional: She is oriented to person, place, and time. She appears well-developed and well-nourished. No distress.  Cardiovascular: Normal rate.   Respiratory: Effort normal.  GI: Soft.   Genitourinary: Vagina normal.  Mod amt greenish discharge in vault,  Cervix cl/th/high, posterior   Musculoskeletal: Normal range of motion.  Neurological: She is alert and oriented to person, place, and time. She has normal reflexes.  Skin: Skin is warm and dry.  Psychiatric: She has a normal mood and affect. Her behavior is normal.   FHR non-reactive toco irreg, but initially frequent, spaced out to rare after receiving procardia   MAU Course  Procedures    Assessment and Plan  IUP at [redacted]w[redacted]d Preterm ctx Wet prep neg GC/CT sent FFN not collected due to recent IC BPP 8/8  Pt received 2 doses procardia w good benefit, and now denies any pain only rare ctx Pt instructed on pelvic rest and light duty at home rv'd [facility] and PTL precautions Pt dc'd home w instructions to f/u in office on Thursday for a FFN and vag exam or call or return to MAU if ctx return or dec FM dc'd home in stable condition   Ustin Cruickshank M 06/10/2013, 2:20 AM

## 2013-06-10 NOTE — MAU Note (Signed)
Pt reports tightness in stomach that comes and goes tonight. Denies problems with pregnancy.

## 2013-06-19 LAB — OB RESULTS CONSOLE GC/CHLAMYDIA
Chlamydia: NEGATIVE
Gonorrhea: NEGATIVE

## 2013-07-09 LAB — OB RESULTS CONSOLE GBS: GBS: NEGATIVE

## 2013-07-22 ENCOUNTER — Inpatient Hospital Stay (HOSPITAL_COMMUNITY)
Admission: AD | Admit: 2013-07-22 | Discharge: 2013-07-22 | Disposition: A | Discharge status: Home / Self Care | Payer: Medicaid Other | Source: Ambulatory Visit | Attending: Obstetrics and Gynecology | Admitting: Obstetrics and Gynecology

## 2013-07-22 ENCOUNTER — Encounter (HOSPITAL_COMMUNITY): Payer: Self-pay | Admitting: *Deleted

## 2013-07-22 DIAGNOSIS — O479 False labor, unspecified: Secondary | ICD-10-CM | POA: Insufficient documentation

## 2013-07-22 NOTE — MAU Note (Signed)
Ctx every 5-7 mins since 4 pm today. 2-3 cm dilated in office today. Denies LOF. Positive fetal movement. Some bloody show since last week.

## 2013-07-31 ENCOUNTER — Inpatient Hospital Stay (HOSPITAL_COMMUNITY): Payer: Medicaid Other

## 2013-07-31 ENCOUNTER — Encounter (HOSPITAL_COMMUNITY): Payer: Self-pay | Admitting: *Deleted

## 2013-07-31 ENCOUNTER — Inpatient Hospital Stay (HOSPITAL_COMMUNITY)
Admission: AD | Admit: 2013-07-31 | Discharge: 2013-08-04 | DRG: 765 | Disposition: A | Discharge status: Home / Self Care | Payer: Medicaid Other | Source: Ambulatory Visit | Attending: Obstetrics and Gynecology | Admitting: Obstetrics and Gynecology

## 2013-07-31 ENCOUNTER — Inpatient Hospital Stay (HOSPITAL_COMMUNITY)
Admission: AD | Admit: 2013-07-31 | Discharge: 2013-07-31 | Disposition: A | Discharge status: Home / Self Care | Payer: Medicaid Other | Source: Ambulatory Visit | Attending: Obstetrics and Gynecology | Admitting: Obstetrics and Gynecology

## 2013-07-31 DIAGNOSIS — D649 Anemia, unspecified: Secondary | ICD-10-CM | POA: Diagnosis present

## 2013-07-31 DIAGNOSIS — O411090 Infection of amniotic sac and membranes, unspecified, unspecified trimester, not applicable or unspecified: Secondary | ICD-10-CM | POA: Diagnosis present

## 2013-07-31 DIAGNOSIS — O471 False labor at or after 37 completed weeks of gestation: Secondary | ICD-10-CM

## 2013-07-31 DIAGNOSIS — O368190 Decreased fetal movements, unspecified trimester, not applicable or unspecified: Secondary | ICD-10-CM | POA: Diagnosis present

## 2013-07-31 DIAGNOSIS — N83209 Unspecified ovarian cyst, unspecified side: Secondary | ICD-10-CM | POA: Diagnosis present

## 2013-07-31 DIAGNOSIS — IMO0001 Reserved for inherently not codable concepts without codable children: Secondary | ICD-10-CM

## 2013-07-31 DIAGNOSIS — O411290 Chorioamnionitis, unspecified trimester, not applicable or unspecified: Secondary | ICD-10-CM | POA: Diagnosis not present

## 2013-07-31 DIAGNOSIS — O368390 Maternal care for abnormalities of the fetal heart rate or rhythm, unspecified trimester, not applicable or unspecified: Secondary | ICD-10-CM | POA: Diagnosis not present

## 2013-07-31 DIAGNOSIS — O9902 Anemia complicating childbirth: Secondary | ICD-10-CM | POA: Diagnosis present

## 2013-07-31 DIAGNOSIS — O34599 Maternal care for other abnormalities of gravid uterus, unspecified trimester: Secondary | ICD-10-CM | POA: Diagnosis present

## 2013-07-31 MED ORDER — ACETAMINOPHEN 325 MG PO TABS
650.0000 mg | ORAL_TABLET | Freq: Once | ORAL | Status: AC
  Administered 2013-07-31: 650 mg via ORAL
  Filled 2013-07-31: qty 2

## 2013-07-31 NOTE — MAU Provider Note (Signed)
History   32yo, [id number] at [redacted]w[redacted]d presents with UCs 5-7 min apart since 0600 and possible ROM.  Pt also reports decreased FM.  Denies VB, recent fever, resp or GI c/o's, UTI or PIH s/s.   Chief Complaint  Patient presents with  . Labor Eval   HPI  OB History   Grav Para Term Preterm Abortions TAB SAB Ect Mult Living   4 3 3       3       Past Medical History  Diagnosis Date  . Abnormal Pap smear 11/2010  . Anemia     Past Surgical History  Procedure Laterality Date  . Wisdom tooth extraction      Family History  Problem Relation Age of Onset  . Asthma Mother     History  Substance Use Topics  . Smoking status: Never Smoker   . Smokeless tobacco: Never Used  . Alcohol Use: No    Allergies: No Known Allergies  Prescriptions prior to admission  Medication Sig Dispense Refill  . ferrous sulfate 325 (65 FE) MG tablet Take 325 mg by mouth daily with breakfast.      . loratadine (CLARITIN) 10 MG tablet Take 10 mg by mouth daily.      . Prenatal Vit-Fe Fumarate-FA (PRENATAL MULTIVITAMIN) TABS tablet Take 1 tablet by mouth at bedtime.        ROS ROS: see HPI above, all other systems are negative  Physical Exam   Blood pressure 110/62, pulse 95, temperature 98.3 F (36.8 C), temperature source Oral, resp. rate 18, height 5\' 3"  (1.6 m), weight 153 lb 12.8 oz (69.763 kg), last menstrual period 10/27/2012.  Physical Exam Chest: Clear Heart: RRR Abdomen: gravid, NT Extremities: WNL  Dilation: 1.5 Effacement (%): 60 Cervical Position: Posterior Station: -2 Presentation: Vertex Exam by:: Haroldine Laws, CNM  FHT: Occasional 15x15 acels UCs: Q 1.5 - 5 mins BPP: 8/8 in 12 min  ED Course  IUP at [redacted]w[redacted]d Labor and ROM check Decreased FM  BPP Amnisure neg No cervical change after 1 hour  D/c home with precautions F/u at already scheduled [facility] at [facility] on 12/22   Haroldine Laws CNM, MSN 07/31/2013 12:00 PM

## 2013-07-31 NOTE — MAU Note (Signed)
Patient states she has been having contractions every 5-7 minutes. States she started leaking clear fluid last night but just enough to make her feel wet, not actively leaking or wearing a pad. States baby is moving less than usual.

## 2013-07-31 NOTE — Progress Notes (Signed)
[name] is a 32 y.o. G4P3003 at [redacted]w[redacted]d   Subjective: Pt reports contractions have not increased in frequency and intensity has not increased.    Objective: BP 117/66  Pulse 108  Temp(Src) 99.5 F (37.5 C) (Oral)  Resp 18  LMP 10/27/2012  FHT:  FHR: 140 bpm, variability: minimal to moderate,  accelerations:  Present,  decelerations:  Absent UC:   irregular, every 3-7 minutes, mild to palpation.  Pt talking through UCs.   SVE:   Dilation: 1 Effacement (%): 50 Station: Ballotable Exam by:: Conni Elliot, CNM No change in SVE  Assessment / Plan: IUP at 39w 4d False labor  Discharge to home. Rev FKC/s/s labor. Pt to f/u as sched at [facility] on Monday, 08/03/13.  Derric Dealmeida O. 07/31/2013, 8:09 PM

## 2013-07-31 NOTE — MAU Note (Signed)
Patient states she is having stronger contractions than this am. Denies bleeding or leaking and reports good fetal movement.

## 2013-07-31 NOTE — H&P (Signed)
[name] is a 32 y.o. female presenting at 4w 4d with c/o regular painful contractions. History  Pt presents unannounced with c/o of regular contractions approximately every 3 mins over the past which she is unable to walk or talk through.  Denies ROM or bldg.  Has felt some fetal movement.  Pt discharged from MAU approx 2.5hrs ago after 2hrs of observation without cervical change.  Pt was also seen in MAU the morning of the day of admission with c/o of contractions as well as decreased fetal movement.  No cervical change was noted during her observation in the morning as well.  BPP obtained this AM was 8/8.    OB History   Grav Para Term Preterm Abortions TAB SAB Ect Mult Living   4 3 3       3      Hx Present Pregnancy: Pt entered prenatal care at [redacted]wks gestation.  Ultrasound for anatomy obtained at that time with no anatomic abnormalities noted and c/w dates.  Quad screen obtained at that time with no increased risk of aneuploidy or NTD.  Pt noted to have anemia with Hgb of 9.6 at time of NOB visit and was placed on iron supplementation.  Pt with c/o of spotting at 28wks which did not increase and resolved spontaneously.  Pt with c/o of decreased fetal movement at 29wks with reactive NST.  Pt had neg FFN due to contractions at 32wks as well as she had c/o of decreased fetal movement which was evaluated by NST which was nonreactive and BPP of 8/8.  Pt with neg 1hr GTT and Hgb of 10.1 at [redacted]wks gestation.  Pt also c/o of decreased fetal movement at 38wks with nonreactive NST and BPP of 8/8.  EFW of 71.9%ile obtained on [place] at 38wks.  Normal amniotic fluid volumes were observed on all ultrasounds.  Pt with c/o of decreased fetal movement on day of admission as well which was eval with BPP and score of 8/8.  Pt treated for vulvovaginal candidiasis x 2 during pregnancy.    Past Medical History  Diagnosis Date  . Abnormal Pap smear 11/2010  . Anemia    Past Surgical History  Procedure  Laterality Date  . Wisdom tooth extraction     Family History: family history includes Asthma in her mother. Social History:  reports that she has never smoked. She has never used smokeless tobacco. She reports that she does not drink alcohol or use illicit drugs.   Prenatal Transfer Tool  Maternal Diabetes: No Genetic Screening: Normal Maternal Ultrasounds/Referrals: Normal Fetal Ultrasounds or other Referrals:  None Maternal Substance Abuse:  No Significant Maternal Medications:  None Significant Maternal Lab Results:  None Other Comments:  None  Review of Systems  Constitutional: Negative.   HENT: Negative.   Eyes: Negative.   Respiratory: Negative.   Cardiovascular: Negative.   Gastrointestinal: Negative.   Genitourinary: Negative.   Musculoskeletal: Negative.   Skin: Negative.   Neurological: Negative.   Endo/Heme/Allergies: Negative.   Psychiatric/Behavioral: Negative.     Dilation: 5 Effacement (%): 80 Station:  (high) Exam by:: Elsie Ra CNM Vertex presentation confirmed by bedside ultrasound  Last menstrual period 10/27/2012. Maternal Exam:  Uterine Assessment: Contraction strength is moderate.  Contraction frequency is regular.   Abdomen: Patient reports no abdominal tenderness. Fundal height is 39.   Estimated fetal weight is 7.5#.   Fetal presentation: vertex  Introitus: Normal vulva. Ferning test: not done.  Nitrazine test: not done. Amniotic fluid  character: not assessed.  Pelvis: adequate for delivery.   Cervix: Cervix evaluated by sterile speculum exam.     Fetal Exam Fetal Monitor Review: Mode: ultrasound.   Baseline rate: 150.  Variability: minimal (<5 bpm).   Pattern: accelerations present and no decelerations.    Fetal State Assessment: Category I - tracings are normal.     Physical Exam  Constitutional: She is oriented to person, place, and time. She appears well-developed and well-nourished.  HENT:  Head: Normocephalic and  atraumatic.  Right Ear: External ear normal.  Left Ear: External ear normal.  Nose: Nose normal.  Eyes: Conjunctivae are normal. Pupils are equal, round, and reactive to light.  Neck: Normal range of motion. Neck supple.  Cardiovascular: Normal rate, regular rhythm and intact distal pulses.   Respiratory: Effort normal and breath sounds normal.  GI: Soft. Bowel sounds are normal.  Genitourinary: Uterus normal.  Musculoskeletal: Normal range of motion.  Neurological: She is alert and oriented to person, place, and time. She has normal reflexes.  Skin: Skin is warm and dry.  Psychiatric: She has a normal mood and affect. Her behavior is normal.    Prenatal labs: ABO, Rh: A positive Antibody: Negative Rubella: Immune RPR: Non Reactive HBsAg: Negative HIV: Non Reactive GBS: Negative (12/10 0000)   Assessment/Plan: IUP at 39w 4d Active labor  Admit to [facility] per consult with Dr. Richardson Dopp. Pt desires epidural placement during labor. Continue to observe FHR carefully.   Pt desires PP BTL and papers were signed on 07/08/13.   Jaleen Grupp O. 07/31/2013, 11:42 PM

## 2013-07-31 NOTE — MAU Note (Signed)
Given gingerale and graham crackers po.

## 2013-08-01 ENCOUNTER — Inpatient Hospital Stay (HOSPITAL_COMMUNITY): Payer: Medicaid Other | Admitting: Anesthesiology

## 2013-08-01 ENCOUNTER — Encounter (HOSPITAL_COMMUNITY): Payer: Medicaid Other | Admitting: Anesthesiology

## 2013-08-01 ENCOUNTER — Encounter (HOSPITAL_COMMUNITY)
Admission: AD | Disposition: A | Discharge status: Home / Self Care | Payer: Self-pay | Source: Ambulatory Visit | Attending: Obstetrics and Gynecology

## 2013-08-01 ENCOUNTER — Encounter (HOSPITAL_COMMUNITY): Payer: Self-pay | Admitting: *Deleted

## 2013-08-01 DIAGNOSIS — IMO0001 Reserved for inherently not codable concepts without codable children: Secondary | ICD-10-CM

## 2013-08-01 DIAGNOSIS — O411290 Chorioamnionitis, unspecified trimester, not applicable or unspecified: Secondary | ICD-10-CM | POA: Diagnosis not present

## 2013-08-01 DIAGNOSIS — O368390 Maternal care for abnormalities of the fetal heart rate or rhythm, unspecified trimester, not applicable or unspecified: Secondary | ICD-10-CM | POA: Diagnosis not present

## 2013-08-01 LAB — CBC
Hemoglobin: 11.3 g/dL — ABNORMAL LOW (ref 12.0–15.0)
MCHC: 33.4 g/dL (ref 30.0–36.0)
RDW: 14.2 % (ref 11.5–15.5)
WBC: 14 10*3/uL — ABNORMAL HIGH (ref 4.0–10.5)

## 2013-08-01 LAB — RPR: RPR Ser Ql: NONREACTIVE

## 2013-08-01 SURGERY — Surgical Case
Anesthesia: Epidural

## 2013-08-01 MED ORDER — KETOROLAC TROMETHAMINE 30 MG/ML IJ SOLN
30.0000 mg | Freq: Four times a day (QID) | INTRAMUSCULAR | Status: DC | PRN
  Administered 2013-08-01: 30 mg via INTRAVENOUS

## 2013-08-01 MED ORDER — METOCLOPRAMIDE HCL 5 MG/ML IJ SOLN: 10.0000 mg | Freq: Three times a day (TID) | INTRAMUSCULAR | Status: DC | PRN

## 2013-08-01 MED ORDER — FENTANYL 2.5 MCG/ML BUPIVACAINE 1/10 % EPIDURAL INFUSION (WH - ANES)
14.0000 mL/h | INTRAMUSCULAR | Status: DC | PRN
  Filled 2013-08-01: qty 125

## 2013-08-01 MED ORDER — OXYCODONE-ACETAMINOPHEN 5-325 MG PO TABS
1.0000 | ORAL_TABLET | ORAL | Status: DC | PRN
  Administered 2013-08-02 – 2013-08-03 (×8): 1 via ORAL
  Administered 2013-08-04: 2 via ORAL
  Administered 2013-08-04 (×2): 1 via ORAL
  Filled 2013-08-01: qty 1
  Filled 2013-08-01: qty 2
  Filled 2013-08-01 (×4): qty 1
  Filled 2013-08-01: qty 2
  Filled 2013-08-01 (×3): qty 1

## 2013-08-01 MED ORDER — PHENYLEPHRINE 40 MCG/ML (10ML) SYRINGE FOR IV PUSH (FOR BLOOD PRESSURE SUPPORT): 80.0000 ug | PREFILLED_SYRINGE | INTRAVENOUS | Status: DC | PRN

## 2013-08-01 MED ORDER — LACTATED RINGERS IV SOLN
INTRAVENOUS | Status: DC | PRN
  Administered 2013-08-01: 06:00:00 via INTRAVENOUS

## 2013-08-01 MED ORDER — IBUPROFEN 600 MG PO TABS: 600.0000 mg | ORAL_TABLET | Freq: Four times a day (QID) | ORAL | Status: DC | PRN

## 2013-08-01 MED ORDER — KETOROLAC TROMETHAMINE 30 MG/ML IJ SOLN: 15.0000 mg | Freq: Once | INTRAMUSCULAR | Status: DC | PRN

## 2013-08-01 MED ORDER — TERBUTALINE SULFATE 1 MG/ML IJ SOLN
0.2500 mg | Freq: Once | INTRAMUSCULAR | Status: AC
  Administered 2013-08-01: 0.25 mg via SUBCUTANEOUS

## 2013-08-01 MED ORDER — SODIUM BICARBONATE 8.4 % IV SOLN
INTRAVENOUS | Status: AC
  Filled 2013-08-01: qty 50

## 2013-08-01 MED ORDER — SENNOSIDES-DOCUSATE SODIUM 8.6-50 MG PO TABS
2.0000 | ORAL_TABLET | ORAL | Status: DC
  Administered 2013-08-02 – 2013-08-04 (×3): 2 via ORAL
  Filled 2013-08-01 (×3): qty 2

## 2013-08-01 MED ORDER — ONDANSETRON HCL 4 MG/2ML IJ SOLN
4.0000 mg | Freq: Three times a day (TID) | INTRAMUSCULAR | Status: DC | PRN
  Administered 2013-08-01: 4 mg via INTRAVENOUS

## 2013-08-01 MED ORDER — ZOLPIDEM TARTRATE 5 MG PO TABS: 5.0000 mg | ORAL_TABLET | Freq: Every evening | ORAL | Status: DC | PRN

## 2013-08-01 MED ORDER — ONDANSETRON HCL 4 MG/2ML IJ SOLN: 4.0000 mg | Freq: Four times a day (QID) | INTRAMUSCULAR | Status: DC | PRN

## 2013-08-01 MED ORDER — NALOXONE HCL 0.4 MG/ML IJ SOLN: 0.4000 mg | INTRAMUSCULAR | Status: DC | PRN

## 2013-08-01 MED ORDER — NALBUPHINE HCL 10 MG/ML IJ SOLN
5.0000 mg | INTRAMUSCULAR | Status: DC | PRN
  Filled 2013-08-01: qty 1

## 2013-08-01 MED ORDER — DIBUCAINE 1 % RE OINT: 1.0000 "application " | TOPICAL_OINTMENT | RECTAL | Status: DC | PRN

## 2013-08-01 MED ORDER — ACETAMINOPHEN 325 MG PO TABS: 650.0000 mg | ORAL_TABLET | ORAL | Status: DC | PRN

## 2013-08-01 MED ORDER — PROMETHAZINE HCL 25 MG/ML IJ SOLN: 12.5000 mg | Freq: Once | INTRAMUSCULAR | Status: DC | PRN

## 2013-08-01 MED ORDER — METHYLERGONOVINE MALEATE 0.2 MG/ML IJ SOLN
INTRAMUSCULAR | Status: DC | PRN
  Administered 2013-08-01: 0.2 mg via INTRAMUSCULAR

## 2013-08-01 MED ORDER — LANOLIN HYDROUS EX OINT: 1.0000 "application " | TOPICAL_OINTMENT | CUTANEOUS | Status: DC | PRN

## 2013-08-01 MED ORDER — PHENYLEPHRINE 40 MCG/ML (10ML) SYRINGE FOR IV PUSH (FOR BLOOD PRESSURE SUPPORT)
80.0000 ug | PREFILLED_SYRINGE | INTRAVENOUS | Status: DC | PRN
  Filled 2013-08-01: qty 10

## 2013-08-01 MED ORDER — LIDOCAINE-EPINEPHRINE (PF) 2 %-1:200000 IJ SOLN
INTRAMUSCULAR | Status: AC
  Filled 2013-08-01: qty 20

## 2013-08-01 MED ORDER — ACETAMINOPHEN 500 MG PO TABS
1000.0000 mg | ORAL_TABLET | Freq: Four times a day (QID) | ORAL | Status: DC | PRN
  Administered 2013-08-01: 1000 mg via ORAL
  Filled 2013-08-01: qty 2

## 2013-08-01 MED ORDER — MORPHINE SULFATE (PF) 0.5 MG/ML IJ SOLN
INTRAMUSCULAR | Status: DC | PRN
  Administered 2013-08-01: 3 mg via EPIDURAL

## 2013-08-01 MED ORDER — SCOPOLAMINE 1 MG/3DAYS TD PT72
1.0000 | MEDICATED_PATCH | Freq: Once | TRANSDERMAL | Status: DC
  Administered 2013-08-01: 1.5 mg via TRANSDERMAL

## 2013-08-01 MED ORDER — SODIUM CHLORIDE 0.9 % IV SOLN
3.0000 g | Freq: Four times a day (QID) | INTRAVENOUS | Status: DC
  Administered 2013-08-01: 3 g via INTRAVENOUS
  Filled 2013-08-01 (×3): qty 3

## 2013-08-01 MED ORDER — LIDOCAINE HCL (PF) 1 % IJ SOLN: 30.0000 mL | INTRAMUSCULAR | Status: DC | PRN

## 2013-08-01 MED ORDER — DIPHENHYDRAMINE HCL 50 MG/ML IJ SOLN: 12.5000 mg | INTRAMUSCULAR | Status: DC | PRN

## 2013-08-01 MED ORDER — ONDANSETRON HCL 4 MG/2ML IJ SOLN
4.0000 mg | INTRAMUSCULAR | Status: DC | PRN
  Filled 2013-08-01: qty 2

## 2013-08-01 MED ORDER — DEXTROSE 5 % IV SOLN
1.0000 ug/kg/h | INTRAVENOUS | Status: DC | PRN
  Filled 2013-08-01: qty 2

## 2013-08-01 MED ORDER — HYDROMORPHONE HCL PF 1 MG/ML IJ SOLN: 0.2500 mg | INTRAMUSCULAR | Status: DC | PRN

## 2013-08-01 MED ORDER — OXYCODONE-ACETAMINOPHEN 5-325 MG PO TABS: 1.0000 | ORAL_TABLET | ORAL | Status: DC | PRN

## 2013-08-01 MED ORDER — MEPERIDINE HCL 25 MG/ML IJ SOLN: 6.2500 mg | INTRAMUSCULAR | Status: DC | PRN

## 2013-08-01 MED ORDER — LACTATED RINGERS IV SOLN
INTRAVENOUS | Status: DC
  Administered 2013-08-01: 01:00:00 via INTRAVENOUS

## 2013-08-01 MED ORDER — OXYTOCIN 40 UNITS IN LACTATED RINGERS INFUSION - SIMPLE MED: 62.5000 mL/h | INTRAVENOUS | Status: AC

## 2013-08-01 MED ORDER — DIPHENHYDRAMINE HCL 50 MG/ML IJ SOLN: 25.0000 mg | INTRAMUSCULAR | Status: DC | PRN

## 2013-08-01 MED ORDER — EPHEDRINE 5 MG/ML INJ: 10.0000 mg | INTRAVENOUS | Status: DC | PRN

## 2013-08-01 MED ORDER — ONDANSETRON HCL 4 MG PO TABS: 4.0000 mg | ORAL_TABLET | ORAL | Status: DC | PRN

## 2013-08-01 MED ORDER — SIMETHICONE 80 MG PO CHEW
80.0000 mg | CHEWABLE_TABLET | Freq: Three times a day (TID) | ORAL | Status: DC
  Administered 2013-08-01 – 2013-08-04 (×9): 80 mg via ORAL
  Filled 2013-08-01 (×9): qty 1

## 2013-08-01 MED ORDER — METHYLERGONOVINE MALEATE 0.2 MG PO TABS: 0.2000 mg | ORAL_TABLET | ORAL | Status: DC | PRN

## 2013-08-01 MED ORDER — OXYTOCIN 10 UNIT/ML IJ SOLN
INTRAMUSCULAR | Status: AC
  Filled 2013-08-01: qty 4

## 2013-08-01 MED ORDER — KETOROLAC TROMETHAMINE 30 MG/ML IJ SOLN
INTRAMUSCULAR | Status: AC
  Administered 2013-08-01: 30 mg via INTRAVENOUS
  Filled 2013-08-01: qty 1

## 2013-08-01 MED ORDER — LACTATED RINGERS IV SOLN
500.0000 mL | Freq: Once | INTRAVENOUS | Status: AC
  Administered 2013-08-01: 500 mL via INTRAVENOUS

## 2013-08-01 MED ORDER — MORPHINE SULFATE 0.5 MG/ML IJ SOLN
INTRAMUSCULAR | Status: AC
  Filled 2013-08-01: qty 10

## 2013-08-01 MED ORDER — WITCH HAZEL-GLYCERIN EX PADS
1.0000 "application " | MEDICATED_PAD | CUTANEOUS | Status: DC | PRN
  Administered 2013-08-04: 1 via TOPICAL

## 2013-08-01 MED ORDER — LACTATED RINGERS IV SOLN
INTRAVENOUS | Status: DC | PRN
  Administered 2013-08-01 (×2): via INTRAVENOUS

## 2013-08-01 MED ORDER — CLINDAMYCIN PHOSPHATE 900 MG/50ML IV SOLN
900.0000 mg | Freq: Three times a day (TID) | INTRAVENOUS | Status: AC
  Administered 2013-08-01 (×4): 900 mg via INTRAVENOUS
  Filled 2013-08-01 (×3): qty 50

## 2013-08-01 MED ORDER — PRENATAL MULTIVITAMIN CH
1.0000 | ORAL_TABLET | Freq: Every day | ORAL | Status: DC
  Administered 2013-08-02 – 2013-08-04 (×3): 1 via ORAL
  Filled 2013-08-01 (×3): qty 1

## 2013-08-01 MED ORDER — LIDOCAINE HCL (PF) 1 % IJ SOLN
INTRAMUSCULAR | Status: DC | PRN
  Administered 2013-08-01 (×2): 9 mL

## 2013-08-01 MED ORDER — SODIUM BICARBONATE 8.4 % IV SOLN
INTRAVENOUS | Status: DC | PRN
  Administered 2013-08-01 (×2): 5 mL via EPIDURAL

## 2013-08-01 MED ORDER — ONDANSETRON HCL 4 MG/2ML IJ SOLN
INTRAMUSCULAR | Status: DC | PRN
  Administered 2013-08-01: 4 mg via INTRAVENOUS

## 2013-08-01 MED ORDER — FENTANYL 2.5 MCG/ML BUPIVACAINE 1/10 % EPIDURAL INFUSION (WH - ANES)
INTRAMUSCULAR | Status: DC | PRN
  Administered 2013-08-01: 14 mL/h via EPIDURAL

## 2013-08-01 MED ORDER — PHENYLEPHRINE HCL 10 MG/ML IJ SOLN
INTRAMUSCULAR | Status: DC | PRN
  Administered 2013-08-01 (×2): 80 ug via INTRAVENOUS
  Administered 2013-08-01: 40 ug via INTRAVENOUS

## 2013-08-01 MED ORDER — CITRIC ACID-SODIUM CITRATE 334-500 MG/5ML PO SOLN
30.0000 mL | ORAL | Status: DC | PRN
  Administered 2013-08-01: 30 mL via ORAL
  Filled 2013-08-01: qty 15

## 2013-08-01 MED ORDER — SCOPOLAMINE 1 MG/3DAYS TD PT72
MEDICATED_PATCH | TRANSDERMAL | Status: AC
  Filled 2013-08-01: qty 1

## 2013-08-01 MED ORDER — SIMETHICONE 80 MG PO CHEW: 80.0000 mg | CHEWABLE_TABLET | ORAL | Status: DC | PRN

## 2013-08-01 MED ORDER — LACTATED RINGERS IV SOLN
INTRAVENOUS | Status: DC
  Administered 2013-08-01: 14:00:00 via INTRAVENOUS

## 2013-08-01 MED ORDER — MENTHOL 3 MG MT LOZG: 1.0000 | LOZENGE | OROMUCOSAL | Status: DC | PRN

## 2013-08-01 MED ORDER — SIMETHICONE 80 MG PO CHEW
80.0000 mg | CHEWABLE_TABLET | ORAL | Status: DC
  Administered 2013-08-02 – 2013-08-04 (×3): 80 mg via ORAL
  Filled 2013-08-01 (×3): qty 1

## 2013-08-01 MED ORDER — DIPHENHYDRAMINE HCL 25 MG PO CAPS: 25.0000 mg | ORAL_CAPSULE | ORAL | Status: DC | PRN

## 2013-08-01 MED ORDER — SODIUM CHLORIDE 0.9 % IJ SOLN: 3.0000 mL | INTRAMUSCULAR | Status: DC | PRN

## 2013-08-01 MED ORDER — TERBUTALINE SULFATE 1 MG/ML IJ SOLN
INTRAMUSCULAR | Status: AC
  Filled 2013-08-01: qty 1

## 2013-08-01 MED ORDER — SODIUM CHLORIDE 0.9 % IV SOLN
3.0000 g | Freq: Four times a day (QID) | INTRAVENOUS | Status: AC
  Administered 2013-08-01 – 2013-08-02 (×2): 3 g via INTRAVENOUS
  Filled 2013-08-01 (×3): qty 3

## 2013-08-01 MED ORDER — IBUPROFEN 600 MG PO TABS
600.0000 mg | ORAL_TABLET | Freq: Four times a day (QID) | ORAL | Status: DC
  Administered 2013-08-01 – 2013-08-04 (×11): 600 mg via ORAL
  Filled 2013-08-01 (×13): qty 1

## 2013-08-01 MED ORDER — ONDANSETRON HCL 4 MG/2ML IJ SOLN
INTRAMUSCULAR | Status: AC
  Filled 2013-08-01: qty 2

## 2013-08-01 MED ORDER — DIPHENHYDRAMINE HCL 25 MG PO CAPS: 25.0000 mg | ORAL_CAPSULE | Freq: Four times a day (QID) | ORAL | Status: DC | PRN

## 2013-08-01 MED ORDER — KETOROLAC TROMETHAMINE 30 MG/ML IJ SOLN: 30.0000 mg | Freq: Four times a day (QID) | INTRAMUSCULAR | Status: DC | PRN

## 2013-08-01 MED ORDER — OXYTOCIN 40 UNITS IN LACTATED RINGERS INFUSION - SIMPLE MED: 62.5000 mL/h | INTRAVENOUS | Status: DC

## 2013-08-01 MED ORDER — METHYLERGONOVINE MALEATE 0.2 MG/ML IJ SOLN: 0.2000 mg | INTRAMUSCULAR | Status: DC | PRN

## 2013-08-01 MED ORDER — LACTATED RINGERS IV SOLN
INTRAVENOUS | Status: DC
  Administered 2013-08-01: 05:00:00 via INTRAUTERINE

## 2013-08-01 MED ORDER — EPHEDRINE 5 MG/ML INJ
10.0000 mg | INTRAVENOUS | Status: DC | PRN
  Filled 2013-08-01: qty 4

## 2013-08-01 MED ORDER — FLEET ENEMA 7-19 GM/118ML RE ENEM: 1.0000 | ENEMA | RECTAL | Status: DC | PRN

## 2013-08-01 MED ORDER — FERROUS SULFATE 325 (65 FE) MG PO TABS
325.0000 mg | ORAL_TABLET | Freq: Two times a day (BID) | ORAL | Status: DC
  Administered 2013-08-02 – 2013-08-04 (×5): 325 mg via ORAL
  Filled 2013-08-01 (×6): qty 1

## 2013-08-01 MED ORDER — IBUPROFEN 600 MG PO TABS
600.0000 mg | ORAL_TABLET | Freq: Four times a day (QID) | ORAL | Status: DC | PRN
  Administered 2013-08-02: 600 mg via ORAL

## 2013-08-01 MED ORDER — FENTANYL CITRATE 0.05 MG/ML IJ SOLN: 100.0000 ug | INTRAMUSCULAR | Status: DC | PRN

## 2013-08-01 MED ORDER — OXYTOCIN BOLUS FROM INFUSION: 500.0000 mL | INTRAVENOUS | Status: DC

## 2013-08-01 MED ORDER — LACTATED RINGERS IV SOLN: 500.0000 mL | INTRAVENOUS | Status: DC | PRN

## 2013-08-01 SURGICAL SUPPLY — 40 items
APL SKNCLS STERI-STRIP NONHPOA (GAUZE/BANDAGES/DRESSINGS) ×1
BARRIER ADHS 3X4 INTERCEED (GAUZE/BANDAGES/DRESSINGS) IMPLANT
BENZOIN TINCTURE PRP APPL 2/3 (GAUZE/BANDAGES/DRESSINGS) ×2 IMPLANT
BRR ADH 4X3 ABS CNTRL BYND (GAUZE/BANDAGES/DRESSINGS)
CLAMP CORD UMBIL (MISCELLANEOUS) IMPLANT
CLOTH BEACON ORANGE TIMEOUT ST (SAFETY) ×2 IMPLANT
CONTAINER PREFILL 10% NBF 15ML (MISCELLANEOUS) IMPLANT
DRAPE LG THREE QUARTER DISP (DRAPES) IMPLANT
DRSG OPSITE POSTOP 4X10 (GAUZE/BANDAGES/DRESSINGS) ×2 IMPLANT
DURAPREP 26ML APPLICATOR (WOUND CARE) ×2 IMPLANT
ELECT REM PT RETURN 9FT ADLT (ELECTROSURGICAL) ×2
ELECTRODE REM PT RTRN 9FT ADLT (ELECTROSURGICAL) ×1 IMPLANT
EXTRACTOR VACUUM M CUP 4 TUBE (SUCTIONS) IMPLANT
GLOVE BIOGEL M 6.5 STRL (GLOVE) ×4 IMPLANT
GLOVE BIOGEL PI IND STRL 6.5 (GLOVE) ×1 IMPLANT
GLOVE BIOGEL PI INDICATOR 6.5 (GLOVE) ×1
GOWN PREVENTION PLUS XLARGE (GOWN DISPOSABLE) ×6 IMPLANT
GOWN STRL REIN XL XLG (GOWN DISPOSABLE) ×4 IMPLANT
KIT ABG SYR 3ML LUER SLIP (SYRINGE) IMPLANT
NDL HYPO 25X5/8 SAFETYGLIDE (NEEDLE) IMPLANT
NEEDLE HYPO 25X5/8 SAFETYGLIDE (NEEDLE) IMPLANT
NS IRRIG 1000ML POUR BTL (IV SOLUTION) ×2 IMPLANT
PACK C SECTION WH (CUSTOM PROCEDURE TRAY) ×2 IMPLANT
PAD OB MATERNITY 4.3X12.25 (PERSONAL CARE ITEMS) ×2 IMPLANT
RTRCTR C-SECT PINK 25CM LRG (MISCELLANEOUS) IMPLANT
RTRCTR C-SECT PINK 34CM XLRG (MISCELLANEOUS) IMPLANT
STAPLER VISISTAT 35W (STAPLE) IMPLANT
STRIP CLOSURE SKIN 1/2X4 (GAUZE/BANDAGES/DRESSINGS) ×2 IMPLANT
SUT PDS AB 0 CT1 27 (SUTURE) ×4 IMPLANT
SUT PLAIN 0 NONE (SUTURE) IMPLANT
SUT VIC AB 0 CTX 36 (SUTURE) ×6
SUT VIC AB 0 CTX36XBRD ANBCTRL (SUTURE) ×3 IMPLANT
SUT VIC AB 2-0 CT1 27 (SUTURE) ×2
SUT VIC AB 2-0 CT1 TAPERPNT 27 (SUTURE) ×1 IMPLANT
SUT VIC AB 3-0 SH 27 (SUTURE)
SUT VIC AB 3-0 SH 27X BRD (SUTURE) IMPLANT
SUT VIC AB 4-0 KS 27 (SUTURE) ×2 IMPLANT
TOWEL OR 17X24 6PK STRL BLUE (TOWEL DISPOSABLE) ×2 IMPLANT
TRAY FOLEY CATH 14FR (SET/KITS/TRAYS/PACK) ×2 IMPLANT
WATER STERILE IRR 1000ML POUR (IV SOLUTION) ×2 IMPLANT

## 2013-08-01 NOTE — Progress Notes (Signed)
[name] is a 32 y.o. G4P4004 at [redacted]w[redacted]d admitted for active labor  Subjective: Now comfortable after epidural.    Objective: BP 101/65  Pulse 107  Temp(Src) 98.9 F (37.2 C) (Axillary)  Resp 18  Ht 5\' 3"  (1.6 m)  Wt 69.763 kg (153 lb 12.8 oz)  BMI 27.25 kg/m2  SpO2 99%  LMP 10/27/2012  FHT:  FHR: 155 bpm, variability: minimal with intervals of moderate, accelerations: 10 x 10 Present,  decelerations:  Absent UC:   regular, every 1.5-2.0 minutes SVE:   Dilation: 5-6 Effacement (%): 80 Station: -1 Exam by:: Conni Elliot, CNM AROM performed without difficulty with moderate amt of clear fluid returned.    Labs: Lab Results  Component Value Date   WBC 14.0* 07/31/2013   HGB 11.3* 07/31/2013   HCT 33.8* 07/31/2013   MCV 91.8 07/31/2013   PLT 228 07/31/2013    Assessment / Plan: Spontaneous labor at 39w 5d  Labor: Slow progress at present-AROM for augmentation Preeclampsia:  no signs or symptoms of toxicity Fetal Wellbeing:  Category II Pain Control:  Epidural I/D:  GBS neg and afebrile Anticipated MOD:  NSVD  Continue to observe FHR carefully.  Euell Schiff O. 08/01/2013, 8:25 AM

## 2013-08-01 NOTE — Anesthesia Procedure Notes (Signed)
Epidural Patient location during procedure: OB Start time: 08/01/2013 12:42 AM End time: 08/01/2013 12:46 AM  Staffing Anesthesiologist: Leilani Able Performed by: anesthesiologist   Preanesthetic Checklist Completed: patient identified, surgical consent, pre-op evaluation, timeout performed, IV checked, risks and benefits discussed and monitors and equipment checked  Epidural Patient position: sitting Prep: site prepped and draped and DuraPrep Patient monitoring: continuous pulse ox and blood pressure Approach: midline Injection technique: LOR air  Needle:  Needle type: Tuohy  Needle gauge: 17 G Needle length: 9 cm and 9 Needle insertion depth: 6 cm Catheter type: closed end flexible Catheter size: 19 Gauge Catheter at skin depth: 11 cm Test dose: negative and Other  Assessment Sensory level: T9 Events: blood not aspirated, injection not painful, no injection resistance, negative IV test and no paresthesia  Additional Notes Reason for block:procedure for pain

## 2013-08-01 NOTE — Preoperative (Signed)
Beta Blockers   Reason not to administer Beta Blockers:Not Applicable 

## 2013-08-01 NOTE — Op Note (Signed)
Cesarean Section Procedure Note  Indications: chorioamnionitis and non-reassuring fetal status  Pre-operative Diagnosis: 39 week 5 day pregnancy.  Post-operative Diagnosis: same  Surgeon: Jessee Avers.   Assistants: Elsie Ra CNM  Anesthesia: Epidural anesthesia  ASA Class: 2   Indication: 39 wks 5 day with repetitive variable decelerations and minimal variability unresolved with iv fluid bolus / O2 terbutaline and amnioinfusion . Moderate vaginal bleeding   Procedure Details   The patient was seen in the Holding Room. The risks, benefits, complications, treatment options, and expected outcomes were discussed with the patient.  The patient concurred with the proposed plan, giving informed consent.  The site of surgery properly noted/marked. The patient was taken to Operating Room # 9, identified as [name] and the procedure verified as C-Section Delivery. A Time Out was held and the above information confirmed.  After induction of anesthesia, the patient was draped and prepped in the usual sterile manner. A Pfannenstiel incision was made and carried down through the subcutaneous tissue to the fascia. Fascial incision was made and extended transversely. The fascia was separated from the underlying rectus tissue superiorly and inferiorly. The peritoneum was identified and entered. Peritoneal incision was extended longitudinally. The utero-vesical peritoneal reflection was incised transversely and the bladder flap was bluntly freed from the lower uterine segment. A low transverse uterine incision was made. Delivered from cephalic presentation was   with Apgar scores of 2 at one minute and 6 at five minutes and 8 at 10 minutes.  After the umbilical cord was clamped and cut cord blood was obtained for evaluation. Arterial ph was drawn prior to collecting cord blood.  The placenta was removed intact . The uterine outline, tubes and appeared normal.  There was a 3 cm simple appearing cyst on  the left ovary. The uterine incision was closed with running locked sutures of 0 vicryl. Hemostasis was observed. Lavage was carried out until clear.  Attention was turned to the right fallopian tube that was grasped with a babcock clamp. An insion was made into the mesosalpinx. 2-0 plan gut was used to ligate a proximal and distal portion of the tube. The intervening segment was excised. The ligated pedicles were cauterized with the bovie.Marland Kitchen Excellent hemostasis was noted. The was repeated on the left fallopian tube. The fascia was then reapproximated with running sutures of 0 pds. The skin was reapproximated with 4-0 vicryl .  Instrument, sponge, and needle counts were correct prior the abdominal closure and at the conclusion of the case.   Findings: Infant in the cephalic presentation. With apgars of 2/6/8 at one / five and 10 minutes respectively. 3 cm cyst on the left ovary. Normal fallopian tubes normal right ovary.   Estimated Blood Loss:  500 ml         Drains: foley catheter         Total IV Fluids:  Per anesthesia ml         Specimens: placenta and portions of the right and left fallopian tube sent to pathology           Implants: None         Complications:  None; patient tolerated the procedure well.         Disposition: PACU - hemodynamically stable.         Condition: stable  Attending Attestation: I performed the procedure.

## 2013-08-01 NOTE — MAU Note (Addendum)
Pt reports she was seen in MAU earlier tonight and she has returned because contractions are stronger. (Pt uncomfortable and Nona CNM did her exam at this time and pt spent brief time in MAU, Nona asked pt history questions. IV done and pt in w/c to L&D 166.)

## 2013-08-01 NOTE — Progress Notes (Signed)
[name] is a 32 y.o. [phone] at [redacted]w[redacted]d admitted for active labor  Subjective: Remains comfortable with epidural at present.  No complaints.  Objective: BP 1120/72  Pulse 97  Temp(Src) 98.9 F (37.2 C) (Axillary)  Resp 18  Ht 5\' 3"  (1.6 m)  Wt 69.763 kg (153 lb 12.8 oz)  BMI 27.25 kg/m2  SpO2 100%  LMP 10/27/2012  FHT:  FHR: 150 bpm, variability: minimal ,  accelerations:  Present - present with scalp stim,  decelerations:  Present Intermittent non-repetitive variable decels with contractions to nadir of 110bpm. UC:   regular, every 1.5-3 minutes SVE:   Dilation: 7 Effacement (%): 80 Station: -1 Exam by:: Conni Elliot, CNM Sm amt of bloody show present.   DECG applied without difficulty after RBA d/w pt.    Labs: Lab Results  Component Value Date   WBC 14.0* 07/31/2013   HGB 11.3* 07/31/2013   HCT 33.8* 07/31/2013   MCV 91.8 07/31/2013   PLT 228 07/31/2013    Assessment / Plan: Spontaneous labor, progressing normally Minimal fetal heart rate variability-intermittent  Labor: Progressing normally Preeclampsia:  no signs or symptoms of toxicity Fetal Wellbeing:  Category II Pain Control:  Epidural I/D:  Neg GBS/Afebrile Anticipated MOD:  NSVD  Will continue to observe FHR carefully due to decreased variability.    [name] O. 08/01/2013, 8:26 AM

## 2013-08-01 NOTE — Transfer of Care (Signed)
Immediate Anesthesia Transfer of Care Note  Patient: [name]  Procedure(s) Performed: Procedure(s): CESAREAN SECTION (N/A)  Patient Location: PACU  Anesthesia Type:Epidural  Level of Consciousness: awake and alert   Airway & Oxygen Therapy: Patient Spontanous Breathing  Post-op Assessment: Report given to PACU RN and Post -op Vital signs reviewed and stable  Post vital signs: Reviewed and stable  Complications: No apparent anesthesia complications

## 2013-08-01 NOTE — Progress Notes (Signed)
[name] is a 32 y.o. [phone] at [redacted]w[redacted]d admitted for active labor  Subjective: Pt now comfortable with epidural.  Has some vag pressure at times.  Has been dozing at intervals.  Objective: BP 101/65  Pulse 107  Temp(Src) 98.9 F (37.2 C) (Oral)  Resp 18  Ht 5\' 3"  (1.6 m)  Wt 69.763 kg (153 lb 12.8 oz)  BMI 27.25 kg/m2  SpO2 100%  LMP 10/27/2012  FHT:  FHR: 150 bpm, variability: moderate to minimal ,  accelerations:  Absent,  decelerations:  Absent UC:   regular, every 2-4 minutes SVE:   Dilation: 5.5 Effacement (%): 80 Station: -1 Exam by:: Conni Elliot, CNM AROM with mod amt clear fluid.  Sm amt bloody show present.  Labs: Lab Results  Component Value Date   WBC 14.0* 07/31/2013   HGB 11.3* 07/31/2013   HCT 33.8* 07/31/2013   MCV 91.8 07/31/2013   PLT 228 07/31/2013    Assessment / Plan: IUP at 39w 5d Early labor  Labor: Early labor Preeclampsia:  no signs or symptoms of toxicity Fetal Wellbeing:  Category II Pain Control:  Epidural I/D:  GBS neg/Afebrile Anticipated MOD:  NSVD  Continue to observe carefully.  Rafael Salway O. 08/01/2013, 2:34 AM

## 2013-08-01 NOTE — Anesthesia Preprocedure Evaluation (Signed)
Anesthesia Evaluation  Patient identified by MRN, date of birth, ID band Patient awake    Reviewed: Allergy & Precautions, H&P , NPO status , Patient's Chart, lab work & pertinent test results  Airway Mallampati: I TM Distance: >3 FB Neck ROM: full    Dental no notable dental hx.    Pulmonary neg pulmonary ROS,    Pulmonary exam normal       Cardiovascular negative cardio ROS      Neuro/Psych negative neurological ROS  negative psych ROS   GI/Hepatic negative GI ROS, Neg liver ROS,   Endo/Other  negative endocrine ROS  Renal/GU negative Renal ROS  negative genitourinary   Musculoskeletal negative musculoskeletal ROS (+)   Abdominal Normal abdominal exam  (+)   Peds  Hematology   Anesthesia Other Findings   Reproductive/Obstetrics (+) Pregnancy                           Anesthesia Physical Anesthesia Plan  ASA: II  Anesthesia Plan: Epidural   Post-op Pain Management:    Induction:   Airway Management Planned:   Additional Equipment:   Intra-op Plan:   Post-operative Plan:   Informed Consent: I have reviewed the patients History and Physical, chart, labs and discussed the procedure including the risks, benefits and alternatives for the proposed anesthesia with the patient or authorized representative who has indicated his/her understanding and acceptance.     Plan Discussed with:   Anesthesia Plan Comments:         Anesthesia Quick Evaluation  

## 2013-08-01 NOTE — Lactation Note (Signed)
This note was copied from the chart of [name]. Lactation Consultation Note  Patient Name: [name] [id number] Date: 08/01/2013 Reason for consult: Initial assessment;NICU baby.  This is mom's fourth baby, born at term but first C/S and first one to be sent to NICU.  Mom did not breastfeed other babies and was planning to formula feed but willing to pump and provide ebm while baby in NICU.  Pumping not initiated until 11 hours of baby's life and her nurse reports that this was due to nausea and vomiting. LC assisted mom with first pumping using DEBP and provided verbal and written instructions for pumping time, frequency and also provided NICU pumping booklet and review of milk storage guidelines while baby in NICU.   LC provided [employer] brochure and reviewed [facility] services and list of community and web site resources.    Maternal Data Formula Feeding for Exclusion: Yes Reason for exclusion: Mother's choice to formula and breast feed on admission;Admission to Intensive Care Unit (ICU) post-partum Infant to breast within first hour of birth: No Breastfeeding delayed due to:: Infant status Does the patient have breastfeeding experience prior to this delivery?: No  Feeding    LATCH Score/Interventions         N/A             Lactation Tools Discussed/Used Tools: Pump Breast pump type: Double-Electric Breast Pump Pump Review: Setup, frequency, and cleaning;Milk Storage Initiated by:: RN, [name] and reinforced by [name] Date initiated:: 08/01/13 (11-12 hours of life)   Consult Status Consult Status: Follow-up Date: 08/02/13 Follow-up type: In-patient    [name] [facility] 08/01/2013, 6:11 PM

## 2013-08-01 NOTE — Anesthesia Postprocedure Evaluation (Signed)
  Anesthesia Post-op Note  Patient: [name]  Procedure(s) Performed: Procedure(s) (LRB): CESAREAN SECTION (N/A)  Patient Location: PACU  Anesthesia Type: Epidural  Level of Consciousness: awake and alert   Airway and Oxygen Therapy: Patient Spontanous Breathing  Post-op Pain: mild  Post-op Assessment: Post-op Vital signs reviewed, Patient's Cardiovascular Status Stable, Respiratory Function Stable, Patent Airway and No signs of Nausea or vomiting  Last Vitals:  Filed Vitals:   08/01/13 0815  BP: 115/68  Pulse: 105  Temp:   Resp: 16    Post-op Vital Signs: stable   Complications: No apparent anesthesia complications

## 2013-08-01 NOTE — Progress Notes (Signed)
[name] is a 32 y.o. G4P3003 at [redacted]w[redacted]d by ultrasound admitted for active labor  Subjective:   Objective: BP 122/65  Pulse 118  Temp(Src) 102.4 F (39.1 C) (Axillary)  Resp 18  Ht 5\' 3"  (1.6 m)  Wt 69.763 kg (153 lb 12.8 oz)  BMI 27.25 kg/m2  SpO2 100%  LMP 10/27/2012      FHT:  FHR: 150 bpm, variability: minimal ,  accelerations:  Abscent,  decelerations:  Present repetitive variable decelerations UC:   regular, every 2 minutes SVE:   9/100/0 moderate vaginal bleeding   Labs: Lab Results  Component Value Date   WBC 14.0* 07/31/2013   HGB 11.3* 07/31/2013   HCT 33.8* 07/31/2013   MCV 91.8 07/31/2013   PLT 228 07/31/2013    Assessment / Plan: 39 wks and 5 days with nonreassuring fetal heart rate and moderate bleeding.  recommend cesarean section due to category II tracing . r/b/a of cesarean section were discussed with the patient including but not limited to infection bleeding damage to bowel bladder baby with the need for further surgery . r/o transfusion HIV/ Hep B&C discussed. pt desires permanent sterilization via btl.. r/b/a of btl including but not limited to 1% r/o failure with 50 % r/o ectopic if failure occurs.  Pt voiced understanding and desires to proceed.  pt with chorioamnionitis.. On unasyn/ plan to add clindamycin.    Awa Bachicha J. 08/01/2013, 5:34 AM

## 2013-08-01 NOTE — Progress Notes (Signed)
[name] is a 32 y.o. G4P3003 at [redacted]w[redacted]d admitted for active labor.  Subjective: Pt comfortable with epidural at present.    Objective: BP 122/65  Pulse 118  Temp(Src) 102.4 F (39.1 C) (Axillary)  Resp 18  Ht 5\' 3"  (1.6 m)  Wt 69.763 kg (153 lb 12.8 oz)  BMI 27.25 kg/m2  SpO2 100%  LMP 10/27/2012     FHT:  FHR: 155 bpm, variability: minimal ,  accelerations:  Absent,  decelerations:  Present variables with nadir to 80-90 x 1 mins with contractions. UC:   regular, every 1.5-2.0 minutes. Mod amt vaginal bleeding now noted.  SVE:   Dilation: 6 Effacement (%): 80 Station: -1 Exam by:: Conni Elliot, CNM  Labs: Lab Results  Component Value Date   WBC 14.0* 07/31/2013   HGB 11.3* 07/31/2013   HCT 33.8* 07/31/2013   MCV 91.8 07/31/2013   PLT 228 07/31/2013    Assessment / Plan: Arrest in active phase of labor Maternal fever Cat II FHR tracing  Labor: Arrest of labor Preeclampsia:  no signs or symptoms of toxicity Fetal Wellbeing:  Category II Pain Control:  Epidural I/D:  Neg GBS with new onset maternal temp Anticipated MOD:  Undetermined  C/W Dr. Richardson Dopp due to temp, FHR strip.   Dr. Richardson Dopp on way to hospital to evaluate. Unasyn 3gm started.  Tylenol 1000mg  given. IUPC inserted without difficult and amnioinfusion 300cc bolus started.    Terbutaline given due to tachysystole.  Colleen Donigan O. 08/01/2013, 5:17 AM

## 2013-08-02 LAB — CBC
MCH: 31 pg (ref 26.0–34.0)
Platelets: 178 10*3/uL (ref 150–400)
RBC: 3.06 MIL/uL — ABNORMAL LOW (ref 3.87–5.11)
RDW: 14.6 % (ref 11.5–15.5)
WBC: 22.4 10*3/uL — ABNORMAL HIGH (ref 4.0–10.5)

## 2013-08-02 NOTE — Progress Notes (Signed)
Clinical Social Work Department PSYCHOSOCIAL ASSESSMENT - MATERNAL/CHILD 08/02/2013  Patient:  BENNETT, VANSCYOC  Account Number:  [id number]  Admit Date:  07/31/2013  Marjo Bicker Name:   Vallery Ridge    Clinical Social Worker:  Gracen Ringwald, LCSW   Date/Time:  08/02/2013 02:00 PM  Date Referred:  08/01/2013   Referral source  NICU     Referred reason  NICU   Other referral source:    I:  FAMILY / HOME ENVIRONMENT Child's legal guardian:  PARENT  Guardian - Name Guardian - Age Guardian - Address  JAEDIN, TRUMBO [address]  [place], [place] [address]  Dannielle Huh, Darron  same as above   Other household support members/support persons Other support: Extensive family support  II  PSYCHOSOCIAL DATA Information Source:    Event organiser Employment:   Father is employed   Surveyor, quantity resources:  [employer] If [employer]:   Other  [employer]  Chemical engineer / Grade:   Maternity Care Coordinator / Child Services Coordination / Early Interventions:  Cultural issues impacting care:    III  STRENGTHS Strengths  Supportive family/friends  Home prepared for Child (including basic supplies)  Adequate Resources  Understanding of illness   Strength comment:    IV  RISK FACTORS AND CURRENT PROBLEMS Current Problem:       V  SOCIAL WORK ASSESSMENT Met with mother who was pleasant and receptive to social work intervention.  Parents are not married.  They cohabitate and have three other dependents ages 68,6, and 97. FOB is employed and mother is a stay at home mom.  Parents report adequate support.  FOB and other relatives were visiting during CSW visit.   Both parents seems to be coping well with newborn NICU admission.  Informed that they have spoken with the medical team and was told that newborn will need at 7 days of antibiotics.  No acute social concerns related at this time.  CSW will follow PRN.    Arlet Marter J, LCSW

## 2013-08-02 NOTE — Anesthesia Postprocedure Evaluation (Signed)
  Anesthesia Post-op Note  Anesthesia Post Note  Patient: [name]  Procedure(s) Performed: Procedure(s) (LRB): CESAREAN SECTION (N/A)  Anesthesia type: Epidural  Patient location: Mother/Baby  Post pain: Pain level controlled  Post assessment: Post-op Vital signs reviewed  Last Vitals:  Filed Vitals:   08/02/13 0624  BP: 81/52  Pulse: 89  Temp: 36.4 C  Resp: 22    Post vital signs: Reviewed  Level of consciousness:alert  Complications: No apparent anesthesia complications

## 2013-08-02 NOTE — Progress Notes (Signed)
Patient ID: [name], female   DOB: 06-11-1981, 32 y.o.   MRN: [id number] Subjective: Postpartum Day 1: Cesarean Delivery secondary to: [facility] w BTL  Patient reports tolerating PO, + flatus and no problems voiding.   no complaints, up ad lib without syncope Infant stable in NICU  Pain well controlled with po meds Pumping for now, states does not intend to continue w BF Mood stable, bonding well Contraception: BTL   Objective: Vital signs in last 24 hours: Temp:  [97.5 F (36.4 C)-99 F (37.2 C)] 98.4 F (36.9 C) (12/21 1749) Pulse Rate:  [80-98] 96 (12/21 1749) Resp:  [17-22] 17 (12/21 1749) BP: (81-93)/(51-61) 92/51 mmHg (12/21 1749) SpO2:  [95 %-98 %] 97 % (12/21 1749)  Physical Exam:  General: alert and no distress Heart: RRR Lungs: CTAB Abdomen: BS x4 Uterine Fundus: firm Incision: healing well, honeycomb dressing CDI   Lochia: appropriate DVT Evaluation: No evidence of DVT seen on physical exam. Negative Homan's sign. No significant calf/ankle edema.   Recent Labs  07/31/13 2355 08/02/13 0602  HGB 11.3* 9.5*  HCT 33.8* 28.2*    Assessment/Plan: Status post Cesarean section. Doing well postoperatively.  Continue current care. Mild anemia, FE supp Support for infant in NICU Discharge Tuesday   Teigan Manner M 08/02/2013, 9:07 PM

## 2013-08-03 ENCOUNTER — Encounter (HOSPITAL_COMMUNITY): Payer: Self-pay | Admitting: Obstetrics and Gynecology

## 2013-08-03 NOTE — Lactation Note (Signed)
This note was copied from the chart of Colleen Siyana Erney. Lactation Consultation Note     Follow up consult with this mom of a NICU baby, term, with neonatal depression and r/o sepsis. Mom decided to provide  EBm for this baby, to help him fight his infection. Mom bottle/formula fed her first 3 children.  I assisted mom with pumping today, and she expressed a little more than 5 mls of transitional milk. she is 55 hours post partum.Mom's breasts are soft, but  She remembers being engorged with past babies. Breast care reviewed with mom, - EBM to nipples and ice with engorgement.   Mom wants to provide EBm while the baby is in the NICU, and does not really want to breast feed. She is willing to try putting hte baby to breast, if he will feed better this wasy.    I did some suck training with the baby. He was able to pull my finger into his mouth, but would not maintain a sucking pattern. He did  Begin gagging, without me placing my finger into his mouth - just him pulling it in on his own. He does have an upper lip frenulum that extends to his upper gum line, which makes it very difficult for him to flnage his upper lip. He also has what I suspect to be a tight tonue frenulum. It is short and close to the tip of his tongue. This could be why he is having trouble sucking, but I am not positive. He would not open his mouth or suck enough for me to evaluate well. I will follow up with this family .  Patient Name: [name] [id number] Date: 08/03/2013     Maternal Data    Feeding Feeding Type: Formula Length of feed: 30 min  LATCH Score/Interventions                      Lactation Tools Discussed/Used     Consult Status      Alfred Levins 08/03/2013, 1:59 PM

## 2013-08-04 MED ORDER — IBUPROFEN 600 MG PO TABS: 600.0000 mg | ORAL_TABLET | Freq: Four times a day (QID) | ORAL | Status: DC

## 2013-08-04 MED ORDER — OXYCODONE-ACETAMINOPHEN 5-325 MG PO TABS: 1.0000 | ORAL_TABLET | ORAL | Status: AC | PRN

## 2013-08-04 NOTE — Discharge Summary (Signed)
Cesarean Section Delivery Discharge Summary  [name]  DOB:    1980/08/20 MRN:    [id number] CSN:    [id number]  Date of admission:                  07/31/13  Date of discharge:                   08/04/13  Procedures this admission: C/s for NRFHT and Chorioamnionitis and BTL  Date of Delivery: 08/01/13 by Dr. Richardson Dopp  Newborn Data:  Live born female  Birth Weight: 7 lb 15.3 oz (3610 g) APGAR: 2, 6  Home with will remain in NICU. Circumcision Plan: still plans an office circ after discharge  History of Present Illness:  [name] is a 32 y.o. female, [id number], who presents at [redacted]w[redacted]d weeks gestation. The patient has been followed at the [facility] and Gynecology division of [facility].    Her pregnancy has been complicated by:  Patient Active Problem List   Diagnosis Date Noted  . Single liveborn, born in hospital, delivered by cesarean delivery 08/02/2013  . Chorioamnionitis 08/01/2013     Hospital course:  The patient was admitted for active labor.   Her postpartum course was not complicated.  She was discharged to home on postpartum day 3 doing well.  Feeding:  breast  Contraception:  bilateral tubal ligation  Discharge hemoglobin:  Hemoglobin  Date Value Range Status  08/02/2013 9.5* 12.0 - 15.0 g/dL Final     HCT  Date Value Range Status  08/02/2013 28.2* 36.0 - 46.0 % Final    Discharge Physical Exam:   General: alert, cooperative and no distress Lochia: appropriate Uterine Fundus: firm Incision: healing well DVT Evaluation: No evidence of DVT seen on physical exam. Negative Homan's sign.  Intrapartum Procedures: cesarean: low cervical, transverse and tubal ligation Postpartum Procedures: antibiotics Complications-Operative and Postpartum: none  Discharge Diagnoses: Term Pregnancy-delivered and Amnionitis  Discharge Information:  Activity:           pelvic rest Diet:                 routine Medications: PNV, Ibuprofen, Iron and Percocet Condition:      stable Instructions:  Care After Cesarean Delivery  Refer to this sheet in the next few weeks. These instructions provide you with information on caring for yourself after your procedure. Your caregiver may also give you specific instructions. Your treatment has been planned according to current medical practices, but problems sometimes occur. Call your caregiver if you have any problems or questions after you go home. HOME CARE INSTRUCTIONS  Only take over-the-counter or prescription medicines as directed by your caregiver.  Do not drink alcohol, especially if you are breastfeeding or taking medicine to relieve pain.  Do not chew or smoke tobacco.  Continue to use good perineal care. Good perineal care includes:  Wiping your perineum from front to back.  Keeping your perineum clean.  Check your cut (incision) daily for increased redness, drainage, swelling, or separation of skin.  Clean your incision gently with soap and water every day, and then pat it dry. If your caregiver says it is okay, leave the incision uncovered. Use a bandage (dressing) if the incision is draining fluid or appears irritated. If the adhesive strips across the incision do not fall off within 7 days, carefully peel them off.  Hug a pillow when coughing or sneezing until your incision is healed. This  helps to relieve pain.  Do not use tampons or douche until your caregiver says it is okay.  Shower, wash your hair, and take tub baths as directed by your caregiver.  Wear a well-fitting bra that provides breast support.  Limit wearing support panties or control-top hose.  Drink enough fluids to keep your urine clear or pale yellow.  Eat high-fiber foods such as whole grain cereals and breads, brown rice, beans, and fresh fruits and vegetables every day. These foods may help prevent or relieve constipation.  Resume activities such as  climbing stairs, driving, lifting, exercising, or traveling as directed by your caregiver.  Talk to your caregiver about resuming sexual activities. This is dependent upon your risk of infection, your rate of healing, and your comfort and desire to resume sexual activity.  Try to have someone help you with your household activities and your newborn for at least a few days after you leave the hospital.  Rest as much as possible. Try to rest or take a nap when your newborn is sleeping.  Increase your activities gradually.  Keep all of your scheduled postpartum appointments. It is very important to keep your scheduled follow-up appointments. At these appointments, your caregiver will be checking to make sure that you are healing physically and emotionally. SEEK MEDICAL CARE IF:   You are passing large clots from your vagina. Save any clots to show your caregiver.  You have a foul smelling discharge from your vagina.  You have trouble urinating.  You are urinating frequently.  You have pain when you urinate.  You have a change in your bowel movements.  You have increasing redness, pain, or swelling near your incision.  You have pus draining from your incision.  Your incision is separating.  You have painful, hard, or reddened breasts.  You have a severe headache.  You have blurred vision or see spots.  You feel sad or depressed.  You have thoughts of hurting yourself or your newborn.  You have questions about your care, the care of your newborn, or medicines.  You are dizzy or lightheaded.  You have a rash.  You have pain, redness, or swelling at the site of the removed intravenous access (IV) tube.  You have nausea or vomiting.  You stopped breastfeeding and have not had a menstrual period within 12 weeks of stopping.  You are not breastfeeding and have not had a menstrual period within 12 weeks of delivery.  You have a fever. SEEK IMMEDIATE MEDICAL CARE  IF:  You have persistent pain.  You have chest pain.  You have shortness of breath.  You faint.  You have leg pain.  You have stomach pain.  Your vaginal bleeding saturates 2 or more sanitary pads in 1 hour. MAKE SURE YOU:   Understand these instructions.  Will watch your condition.  Will get help right away if you are not doing well or get worse. Document Released: 04/21/2002 Document Revised: 04/23/2012 Document Reviewed: 03/26/2012 [facility] Patient Information 2014 [place], [place].   Postpartum Depression and Baby Blues  The postpartum period begins right after the birth of a baby. During this time, there is often a great amount of joy and excitement. It is also a time of considerable changes in the life of the parent(s). Regardless of how many times a mother gives birth, each child brings new challenges and dynamics to the family. It is not unusual to have feelings of excitement accompanied by confusing shifts in moods, emotions, and  thoughts. All mothers are at risk of developing postpartum depression or the "baby blues." These mood changes can occur right after giving birth, or they may occur many months after giving birth. The baby blues or postpartum depression can be mild or severe. Additionally, postpartum depression can resolve rather quickly, or it can be a long-term condition. CAUSES Elevated hormones and their rapid decline are thought to be a main cause of postpartum depression and the baby blues. There are a number of hormones that radically change during and after pregnancy. Estrogen and progesterone usually decrease immediately after delivering your baby. The level of thyroid hormone and various cortisol steroids also rapidly drop. Other factors that play a major role in these changes include major life events and genetics.  RISK FACTORS If you have any of the following risks for the baby blues or postpartum depression, know what symptoms to watch out for during the  postpartum period. Risk factors that may increase the likelihood of getting the baby blues or postpartum depression include:  Havinga personal or family history of depression.  Having depression while being pregnant.  Having premenstrual or oral contraceptive-associated mood issues.  Having exceptional life stress.  Having marital conflict.  Lacking a social support network.  Having a baby with special needs.  Having health problems such as diabetes. SYMPTOMS Baby blues symptoms include:  Brief fluctuations in mood, such as going from extreme happiness to sadness.  Decreased concentration.  Difficulty sleeping.  Crying spells, tearfulness.  Irritability.  Anxiety. Postpartum depression symptoms typically begin within the first month after giving birth. These symptoms include:  Difficulty sleeping or excessive sleepiness.  Marked weight loss.  Agitation.  Feelings of worthlessness.  Lack of interest in activity or food. Postpartum psychosis is a very concerning condition and can be dangerous. Fortunately, it is rare. Displaying any of the following symptoms is cause for immediate medical attention. Postpartum psychosis symptoms include:  Hallucinations and delusions.  Bizarre or disorganized behavior.  Confusion or disorientation. DIAGNOSIS  A diagnosis is made by an evaluation of your symptoms. There are no medical or lab tests that lead to a diagnosis, but there are various questionnaires that a caregiver may use to identify those with the baby blues, postpartum depression, or psychosis. Often times, a screening tool called the [place] Postnatal Depression Scale is used to diagnose depression in the postpartum period.  TREATMENT The baby blues usually goes away on its own in 1 to 2 weeks. Social support is often all that is needed. You should be encouraged to get adequate sleep and rest. Occasionally, you may be given medicines to help you sleep.  Postpartum  depression requires treatment as it can last several months or longer if it is not treated. Treatment may include individual or group therapy, medicine, or both to address any social, physiological, and psychological factors that may play a role in the depression. Regular exercise, a healthy diet, rest, and social support may also be strongly recommended.  Postpartum psychosis is more serious and needs treatment right away. Hospitalization is often needed. HOME CARE INSTRUCTIONS  Get as much rest as you can. Nap when the baby sleeps.  Exercise regularly. Some women find yoga and walking to be beneficial.  Eat a balanced and nourishing diet.  Do little things that you enjoy. Have a cup of tea, take a bubble bath, read your favorite magazine, or listen to your favorite music.  Avoid alcohol.  Ask for help with household chores, cooking, grocery shopping, or running  errands as needed. Do not try to do everything.  Talk to people close to you about how you are feeling. Get support from your partner, family members, friends, or other new moms.  Try to stay positive in how you think. Think about the things you are grateful for.  Do not spend a lot of time alone.  Only take medicine as directed by your caregiver.  Keep all your postpartum appointments.  Let your caregiver know if you have any concerns. SEEK MEDICAL CARE IF: You are having a reaction or problems with your medicine. SEEK IMMEDIATE MEDICAL CARE IF:  You have suicidal feelings.  You feel you may harm the baby or someone else. Document Released: 05/03/2004 Document Revised: 10/22/2011 Document Reviewed: 06/05/2011 [facility] Patient Information 2014 [place], [place].  Discharge to: home  Follow-up Information   Follow up with [facility]. Schedule an appointment as soon as possible for a visit in 6 weeks. (Call with any questions or concerns.)    Specialty:  Obstetrics and Gynecology   Contact  information:   [address] [place] [address] [phone]       Haroldine Laws 08/04/2013

## 2013-08-04 NOTE — Progress Notes (Signed)
UR completed 

## 2013-08-04 NOTE — Progress Notes (Signed)
Subjective: Postpartum Day 2: Cesarean Delivery due to NRFHT and chorio Patient up ad lib, reports no syncope or dizziness.  Pt in NICU all day - unable to see  Objective: Vital signs in last 24 hours: Temp:  [98.3 F (36.8 C)-98.5 F (36.9 C)] 98.3 F (36.8 C) (12/22 1852) Pulse Rate:  [78-89] 89 (12/22 1852) Resp:  [18-20] 20 (12/22 1852) BP: (90-94)/(56-61) 94/61 mmHg (12/22 1852)  Physical Exam:    Recent Labs  08/02/13 0602  HGB 9.5*  HCT 28.2*    Assessment/Plan: Status post Cesarean section day 2. Continue current care. Discharge home tomorrow    Haroldine Laws 08/04/2013, 5:25 AM

## 2013-08-12 ENCOUNTER — Inpatient Hospital Stay (HOSPITAL_COMMUNITY): Admission: RE | Admit: 2013-08-12 | Payer: Medicaid Other | Source: Ambulatory Visit

## 2014-06-14 ENCOUNTER — Encounter (HOSPITAL_COMMUNITY): Payer: Self-pay | Admitting: Obstetrics and Gynecology

## 2014-12-15 ENCOUNTER — Encounter (HOSPITAL_COMMUNITY): Payer: Self-pay | Admitting: *Deleted

## 2014-12-15 ENCOUNTER — Emergency Department (HOSPITAL_COMMUNITY)
Admission: EM | Admit: 2014-12-15 | Discharge: 2014-12-15 | Disposition: A | Discharge status: Home / Self Care | Payer: Medicaid Other | Attending: Emergency Medicine | Admitting: Emergency Medicine

## 2014-12-15 DIAGNOSIS — R519 Headache, unspecified: Secondary | ICD-10-CM

## 2014-12-15 DIAGNOSIS — Z79899 Other long term (current) drug therapy: Secondary | ICD-10-CM | POA: Diagnosis not present

## 2014-12-15 DIAGNOSIS — D649 Anemia, unspecified: Secondary | ICD-10-CM | POA: Diagnosis not present

## 2014-12-15 DIAGNOSIS — Y9389 Activity, other specified: Secondary | ICD-10-CM | POA: Insufficient documentation

## 2014-12-15 DIAGNOSIS — S3992XA Unspecified injury of lower back, initial encounter: Secondary | ICD-10-CM | POA: Diagnosis present

## 2014-12-15 DIAGNOSIS — S0990XA Unspecified injury of head, initial encounter: Secondary | ICD-10-CM | POA: Insufficient documentation

## 2014-12-15 DIAGNOSIS — R51 Headache: Secondary | ICD-10-CM

## 2014-12-15 DIAGNOSIS — Y998 Other external cause status: Secondary | ICD-10-CM | POA: Diagnosis not present

## 2014-12-15 DIAGNOSIS — Y92410 Unspecified street and highway as the place of occurrence of the external cause: Secondary | ICD-10-CM | POA: Diagnosis not present

## 2014-12-15 DIAGNOSIS — M545 Low back pain, unspecified: Secondary | ICD-10-CM

## 2014-12-15 MED ORDER — CYCLOBENZAPRINE HCL 10 MG PO TABS: 10.0000 mg | ORAL_TABLET | Freq: Two times a day (BID) | ORAL | Status: DC | PRN

## 2014-12-15 MED ORDER — IBUPROFEN 600 MG PO TABS: 600.0000 mg | ORAL_TABLET | Freq: Four times a day (QID) | ORAL | Status: AC | PRN
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
## 2016-02-23 ENCOUNTER — Encounter: Payer: Self-pay | Admitting: Obstetrics & Gynecology

## 2016-02-23 ENCOUNTER — Ambulatory Visit (INDEPENDENT_AMBULATORY_CARE_PROVIDER_SITE_OTHER): Payer: Medicaid Other | Admitting: Obstetrics & Gynecology

## 2016-02-23 VITALS — BP 101/72 | HR 85 | Ht 64.0 in | Wt 129.0 lb

## 2016-02-23 DIAGNOSIS — Z113 Encounter for screening for infections with a predominantly sexual mode of transmission: Secondary | ICD-10-CM

## 2016-02-23 DIAGNOSIS — Z01419 Encounter for gynecological examination (general) (routine) without abnormal findings: Secondary | ICD-10-CM | POA: Diagnosis not present

## 2016-02-23 DIAGNOSIS — Z0000 Encounter for general adult medical examination without abnormal findings: Secondary | ICD-10-CM

## 2016-02-23 DIAGNOSIS — R87810 Cervical high risk human papillomavirus (HPV) DNA test positive: Secondary | ICD-10-CM | POA: Insufficient documentation

## 2016-02-23 NOTE — Patient Instructions (Signed)
Preventive Care for Adults, Female A healthy lifestyle and preventive care can promote health and wellness. Preventive health guidelines for women include the following key practices.  A routine yearly physical is a good way to check with your health care provider about your health and preventive screening. It is a chance to share any concerns and updates on your health and to receive a thorough exam.  Visit your dentist for a routine exam and preventive care every 6 months. Brush your teeth twice a day and floss once a day. Good oral hygiene prevents tooth decay and gum disease.  The frequency of eye exams is based on your age, health, family medical history, use of contact lenses, and other factors. Follow your health care provider's recommendations for frequency of eye exams.  Eat a healthy diet. Foods like vegetables, fruits, whole grains, low-fat dairy products, and lean protein foods contain the nutrients you need without too many calories. Decrease your intake of foods high in solid fats, added sugars, and salt. Eat the right amount of calories for you.Get information about a proper diet from your health care provider, if necessary.  Regular physical exercise is one of the most important things you can do for your health. Most adults should get at least 150 minutes of moderate-intensity exercise (any activity that increases your heart rate and causes you to sweat) each week. In addition, most adults need muscle-strengthening exercises on 2 or more days a week.  Maintain a healthy weight. The body mass index (BMI) is a screening tool to identify possible weight problems. It provides an estimate of body fat based on height and weight. Your health care provider can find your BMI and can help you achieve or maintain a healthy weight.For adults 20 years and older:  A BMI below 18.5 is considered underweight.  A BMI of 18.5 to 24.9 is normal.  A BMI of 25 to 29.9 is considered  overweight.  A BMI of 30 and above is considered obese.  Maintain normal blood lipids and cholesterol levels by exercising and minimizing your intake of saturated fat. Eat a balanced diet with plenty of fruit and vegetables. Blood tests for lipids and cholesterol should begin at age 64 and be repeated every 5 years. If your lipid or cholesterol levels are high, you are over 50, or you are at high risk for heart disease, you may need your cholesterol levels checked more frequently.Ongoing high lipid and cholesterol levels should be treated with medicines if diet and exercise are not working.  If you smoke, find out from your health care provider how to quit. If you do not use tobacco, do not start.  Lung cancer screening is recommended for adults aged 52-80 years who are at high risk for developing lung cancer because of a history of smoking. A yearly low-dose CT scan of the lungs is recommended for people who have at least a 30-pack-year history of smoking and are a current smoker or have quit within the past 15 years. A pack year of smoking is smoking an average of 1 pack of cigarettes a day for 1 year (for example: 1 pack a day for 30 years or 2 packs a day for 15 years). Yearly screening should continue until the smoker has stopped smoking for at least 15 years. Yearly screening should be stopped for people who develop a health problem that would prevent them from having lung cancer treatment.  If you are pregnant, do not drink alcohol. If you are  breastfeeding, be very cautious about drinking alcohol. If you are not pregnant and choose to drink alcohol, do not have more than 1 drink per day. One drink is considered to be 12 ounces (355 mL) of beer, 5 ounces (148 mL) of wine, or 1.5 ounces (44 mL) of liquor.  Avoid use of street drugs. Do not share needles with anyone. Ask for help if you need support or instructions about stopping the use of drugs.  High blood pressure causes heart disease and  increases the risk of stroke. Your blood pressure should be checked at least every 1 to 2 years. Ongoing high blood pressure should be treated with medicines if weight loss and exercise do not work.  If you are 25-78 years old, ask your health care provider if you should take aspirin to prevent strokes.  Diabetes screening is done by taking a blood sample to check your blood glucose level after you have not eaten for a certain period of time (fasting). If you are not overweight and you do not have risk factors for diabetes, you should be screened once every 3 years starting at age 86. If you are overweight or obese and you are 3-87 years of age, you should be screened for diabetes every year as part of your cardiovascular risk assessment.  Breast cancer screening is essential preventive care for women. You should practice "breast self-awareness." This means understanding the normal appearance and feel of your breasts and may include breast self-examination. Any changes detected, no matter how small, should be reported to a health care provider. Women in their 66s and 30s should have a clinical breast exam (CBE) by a health care provider as part of a regular health exam every 1 to 3 years. After age 43, women should have a CBE every year. Starting at age 37, women should consider having a mammogram (breast X-ray test) every year. Women who have a family history of breast cancer should talk to their health care provider about genetic screening. Women at a high risk of breast cancer should talk to their health care providers about having an MRI and a mammogram every year.  Breast cancer gene (BRCA)-related cancer risk assessment is recommended for women who have family members with BRCA-related cancers. BRCA-related cancers include breast, ovarian, tubal, and peritoneal cancers. Having family members with these cancers may be associated with an increased risk for harmful changes (mutations) in the breast  cancer genes BRCA1 and BRCA2. Results of the assessment will determine the need for genetic counseling and BRCA1 and BRCA2 testing.  Your health care provider may recommend that you be screened regularly for cancer of the pelvic organs (ovaries, uterus, and vagina). This screening involves a pelvic examination, including checking for microscopic changes to the surface of your cervix (Pap test). You may be encouraged to have this screening done every 3 years, beginning at age 78.  For women ages 79-65, health care providers may recommend pelvic exams and Pap testing every 3 years, or they may recommend the Pap and pelvic exam, combined with testing for human papilloma virus (HPV), every 5 years. Some types of HPV increase your risk of cervical cancer. Testing for HPV may also be done on women of any age with unclear Pap test results.  Other health care providers may not recommend any screening for nonpregnant women who are considered low risk for pelvic cancer and who do not have symptoms. Ask your health care provider if a screening pelvic exam is right for  you.  If you have had past treatment for cervical cancer or a condition that could lead to cancer, you need Pap tests and screening for cancer for at least 20 years after your treatment. If Pap tests have been discontinued, your risk factors (such as having a new sexual partner) need to be reassessed to determine if screening should resume. Some women have medical problems that increase the chance of getting cervical cancer. In these cases, your health care provider may recommend more frequent screening and Pap tests.  Colorectal cancer can be detected and often prevented. Most routine colorectal cancer screening begins at the age of 50 years and continues through age 75 years. However, your health care provider may recommend screening at an earlier age if you have risk factors for colon cancer. On a yearly basis, your health care provider may provide  home test kits to check for hidden blood in the stool. Use of a small camera at the end of a tube, to directly examine the colon (sigmoidoscopy or colonoscopy), can detect the earliest forms of colorectal cancer. Talk to your health care provider about this at age 50, when routine screening begins. Direct exam of the colon should be repeated every 5-10 years through age 75 years, unless early forms of precancerous polyps or small growths are found.  People who are at an increased risk for hepatitis B should be screened for this virus. You are considered at high risk for hepatitis B if:  You were born in a country where hepatitis B occurs often. Talk with your health care provider about which countries are considered high risk.  Your parents were born in a high-risk country and you have not received a shot to protect against hepatitis B (hepatitis B vaccine).  You have HIV or AIDS.  You use needles to inject street drugs.  You live with, or have sex with, someone who has hepatitis B.  You get hemodialysis treatment.  You take certain medicines for conditions like cancer, organ transplantation, and autoimmune conditions.  Hepatitis C blood testing is recommended for all people born from 1945 through 1965 and any individual with known risks for hepatitis C.  Practice safe sex. Use condoms and avoid high-risk sexual practices to reduce the spread of sexually transmitted infections (STIs). STIs include gonorrhea, chlamydia, syphilis, trichomonas, herpes, HPV, and human immunodeficiency virus (HIV). Herpes, HIV, and HPV are viral illnesses that have no cure. They can result in disability, cancer, and death.  You should be screened for sexually transmitted illnesses (STIs) including gonorrhea and chlamydia if:  You are sexually active and are younger than 24 years.  You are older than 24 years and your health care provider tells you that you are at risk for this type of infection.  Your sexual  activity has changed since you were last screened and you are at an increased risk for chlamydia or gonorrhea. Ask your health care provider if you are at risk.  If you are at risk of being infected with HIV, it is recommended that you take a prescription medicine daily to prevent HIV infection. This is called preexposure prophylaxis (PrEP). You are considered at risk if:  You are sexually active and do not regularly use condoms or know the HIV status of your partner(s).  You take drugs by injection.  You are sexually active with a partner who has HIV.  Talk with your health care provider about whether you are at high risk of being infected with HIV. If   you choose to begin PrEP, you should first be tested for HIV. You should then be tested every 3 months for as long as you are taking PrEP.  Osteoporosis is a disease in which the bones lose minerals and strength with aging. This can result in serious bone fractures or breaks. The risk of osteoporosis can be identified using a bone density scan. Women ages 1 years and over and women at risk for fractures or osteoporosis should discuss screening with their health care providers. Ask your health care provider whether you should take a calcium supplement or vitamin D to reduce the rate of osteoporosis.  Menopause can be associated with physical symptoms and risks. Hormone replacement therapy is available to decrease symptoms and risks. You should talk to your health care provider about whether hormone replacement therapy is right for you.  Use sunscreen. Apply sunscreen liberally and repeatedly throughout the day. You should seek shade when your shadow is shorter than you. Protect yourself by wearing long sleeves, pants, a wide-brimmed hat, and sunglasses year round, whenever you are outdoors.  Once a month, do a whole body skin exam, using a mirror to look at the skin on your back. Tell your health care provider of new moles, moles that have irregular  borders, moles that are larger than a pencil eraser, or moles that have changed in shape or color.  Stay current with required vaccines (immunizations).  Influenza vaccine. All adults should be immunized every year.  Tetanus, diphtheria, and acellular pertussis (Td, Tdap) vaccine. Pregnant women should receive 1 dose of Tdap vaccine during each pregnancy. The dose should be obtained regardless of the length of time since the last dose. Immunization is preferred during the 27th-36th week of gestation. An adult who has not previously received Tdap or who does not know her vaccine status should receive 1 dose of Tdap. This initial dose should be followed by tetanus and diphtheria toxoids (Td) booster doses every 10 years. Adults with an unknown or incomplete history of completing a 3-dose immunization series with Td-containing vaccines should begin or complete a primary immunization series including a Tdap dose. Adults should receive a Td booster every 10 years.  Varicella vaccine. An adult without evidence of immunity to varicella should receive 2 doses or a second dose if she has previously received 1 dose. Pregnant females who do not have evidence of immunity should receive the first dose after pregnancy. This first dose should be obtained before leaving the health care facility. The second dose should be obtained 4-8 weeks after the first dose.  Human papillomavirus (HPV) vaccine. Females aged 13-26 years who have not received the vaccine previously should obtain the 3-dose series. The vaccine is not recommended for use in pregnant females. However, pregnancy testing is not needed before receiving a dose. If a female is found to be pregnant after receiving a dose, no treatment is needed. In that case, the remaining doses should be delayed until after the pregnancy. Immunization is recommended for any person with an immunocompromised condition through the age of 24 years if she did not get any or all doses  earlier. During the 3-dose series, the second dose should be obtained 4-8 weeks after the first dose. The third dose should be obtained 24 weeks after the first dose and 16 weeks after the second dose.  Zoster vaccine. One dose is recommended for adults aged 97 years or older unless certain conditions are present.  Measles, mumps, and rubella (MMR) vaccine. Adults born  before 1957 generally are considered immune to measles and mumps. Adults born in [birth year] or later should have 1 or more doses of MMR vaccine unless there is a contraindication to the vaccine or there is laboratory evidence of immunity to each of the three diseases. A routine second dose of MMR vaccine should be obtained at least 28 days after the first dose for students attending postsecondary schools, health care workers, or international travelers. People who received inactivated measles vaccine or an unknown type of measles vaccine during 1963-1967 should receive 2 doses of MMR vaccine. People who received inactivated mumps vaccine or an unknown type of mumps vaccine before 1979 and are at high risk for mumps infection should consider immunization with 2 doses of MMR vaccine. For females of childbearing age, rubella immunity should be determined. If there is no evidence of immunity, females who are not pregnant should be vaccinated. If there is no evidence of immunity, females who are pregnant should delay immunization until after pregnancy. Unvaccinated health care workers born before 60 who lack laboratory evidence of measles, mumps, or rubella immunity or laboratory confirmation of disease should consider measles and mumps immunization with 2 doses of MMR vaccine or rubella immunization with 1 dose of MMR vaccine.  Pneumococcal 13-valent conjugate (PCV13) vaccine. When indicated, a person who is uncertain of his immunization history and has no record of immunization should receive the PCV13 vaccine. All adults 61 years of age and older  should receive this vaccine. An adult aged 92 years or older who has certain medical conditions and has not been previously immunized should receive 1 dose of PCV13 vaccine. This PCV13 should be followed with a dose of pneumococcal polysaccharide (PPSV23) vaccine. Adults who are at high risk for pneumococcal disease should obtain the PPSV23 vaccine at least 8 weeks after the dose of PCV13 vaccine. Adults older than 35 years of age who have normal immune system function should obtain the PPSV23 vaccine dose at least 1 year after the dose of PCV13 vaccine.  Pneumococcal polysaccharide (PPSV23) vaccine. When PCV13 is also indicated, PCV13 should be obtained first. All adults aged 2 years and older should be immunized. An adult younger than age 30 years who has certain medical conditions should be immunized. Any person who resides in a nursing home or long-term care facility should be immunized. An adult smoker should be immunized. People with an immunocompromised condition and certain other conditions should receive both PCV13 and PPSV23 vaccines. People with human immunodeficiency virus (HIV) infection should be immunized as soon as possible after diagnosis. Immunization during chemotherapy or radiation therapy should be avoided. Routine use of PPSV23 vaccine is not recommended for American Indians, [place] Natives, or people younger than 65 years unless there are medical conditions that require PPSV23 vaccine. When indicated, people who have unknown immunization and have no record of immunization should receive PPSV23 vaccine. One-time revaccination 5 years after the first dose of PPSV23 is recommended for people aged 19-64 years who have chronic kidney failure, nephrotic syndrome, asplenia, or immunocompromised conditions. People who received 1-2 doses of PPSV23 before age 44 years should receive another dose of PPSV23 vaccine at age 83 years or later if at least 5 years have passed since the previous dose. Doses  of PPSV23 are not needed for people immunized with PPSV23 at or after age 20 years.  Meningococcal vaccine. Adults with asplenia or persistent complement component deficiencies should receive 2 doses of quadrivalent meningococcal conjugate (MenACWY-D) vaccine. The doses should be obtained  at least 2 months apart. Microbiologists working with certain meningococcal bacteria, [place] recruits, people at risk during an outbreak, and people who travel to or live in countries with a high rate of meningitis should be immunized. A first-year college student up through age 28 years who is living in a residence hall should receive a dose if she did not receive a dose on or after her 16th birthday. Adults who have certain high-risk conditions should receive one or more doses of vaccine.  Hepatitis A vaccine. Adults who wish to be protected from this disease, have certain high-risk conditions, work with hepatitis A-infected animals, work in hepatitis A research labs, or travel to or work in countries with a high rate of hepatitis A should be immunized. Adults who were previously unvaccinated and who anticipate close contact with an international adoptee during the first 60 days after arrival in the [place] from a country with a high rate of hepatitis A should be immunized.  Hepatitis B vaccine. Adults who wish to be protected from this disease, have certain high-risk conditions, may be exposed to blood or other infectious body fluids, are household contacts or sex partners of hepatitis B positive people, are clients or workers in certain care facilities, or travel to or work in countries with a high rate of hepatitis B should be immunized.  Haemophilus influenzae type b (Hib) vaccine. A previously unvaccinated person with asplenia or sickle cell disease or having a scheduled splenectomy should receive 1 dose of Hib vaccine. Regardless of previous immunization, a recipient of a hematopoietic stem cell transplant  should receive a 3-dose series 6-12 months after her successful transplant. Hib vaccine is not recommended for adults with HIV infection. Preventive Services / Frequency Ages 71 to 87 years  Blood pressure check.** / Every 3-5 years.  Lipid and cholesterol check.** / Every 5 years beginning at age 1.  Clinical breast exam.** / Every 3 years for women in their 3s and 31s.  BRCA-related cancer risk assessment.** / For women who have family members with a BRCA-related cancer (breast, ovarian, tubal, or peritoneal cancers).  Pap test.** / Every 2 years from ages 50 through 86. Every 3 years starting at age 87 through age 7 or 75 with a history of 3 consecutive normal Pap tests.  HPV screening.** / Every 3 years from ages 59 through ages 35 to 6 with a history of 3 consecutive normal Pap tests.  Hepatitis C blood test.** / For any individual with known risks for hepatitis C.  Skin self-exam. / Monthly.  Influenza vaccine. / Every year.  Tetanus, diphtheria, and acellular pertussis (Tdap, Td) vaccine.** / Consult your health care provider. Pregnant women should receive 1 dose of Tdap vaccine during each pregnancy. 1 dose of Td every 10 years.  Varicella vaccine.** / Consult your health care provider. Pregnant females who do not have evidence of immunity should receive the first dose after pregnancy.  HPV vaccine. / 3 doses over 6 months, if 72 and younger. The vaccine is not recommended for use in pregnant females. However, pregnancy testing is not needed before receiving a dose.  Measles, mumps, rubella (MMR) vaccine.** / You need at least 1 dose of MMR if you were born in 1957 or later. You may also need a 2nd dose. For females of childbearing age, rubella immunity should be determined. If there is no evidence of immunity, females who are not pregnant should be vaccinated. If there is no evidence of immunity, females who are  pregnant should delay immunization until after  pregnancy.  Pneumococcal 13-valent conjugate (PCV13) vaccine.** / Consult your health care provider.  Pneumococcal polysaccharide (PPSV23) vaccine.** / 1 to 2 doses if you smoke cigarettes or if you have certain conditions.  Meningococcal vaccine.** / 1 dose if you are age 87 to 44 years and a Market researcher living in a residence hall, or have one of several medical conditions, you need to get vaccinated against meningococcal disease. You may also need additional booster doses.  Hepatitis A vaccine.** / Consult your health care provider.  Hepatitis B vaccine.** / Consult your health care provider.  Haemophilus influenzae type b (Hib) vaccine.** / Consult your health care provider. Ages 86 to 38 years  Blood pressure check.** / Every year.  Lipid and cholesterol check.** / Every 5 years beginning at age 49 years.  Lung cancer screening. / Every year if you are aged 71-80 years and have a 30-pack-year history of smoking and currently smoke or have quit within the past 15 years. Yearly screening is stopped once you have quit smoking for at least 15 years or develop a health problem that would prevent you from having lung cancer treatment.  Clinical breast exam.** / Every year after age 51 years.  BRCA-related cancer risk assessment.** / For women who have family members with a BRCA-related cancer (breast, ovarian, tubal, or peritoneal cancers).  Mammogram.** / Every year beginning at age 18 years and continuing for as long as you are in good health. Consult with your health care provider.  Pap test.** / Every 3 years starting at age 63 years through age 37 or 57 years with a history of 3 consecutive normal Pap tests.  HPV screening.** / Every 3 years from ages 41 years through ages 76 to 23 years with a history of 3 consecutive normal Pap tests.  Fecal occult blood test (FOBT) of stool. / Every year beginning at age 36 years and continuing until age 51 years. You may not need  to do this test if you get a colonoscopy every 10 years.  Flexible sigmoidoscopy or colonoscopy.** / Every 5 years for a flexible sigmoidoscopy or every 10 years for a colonoscopy beginning at age 36 years and continuing until age 35 years.  Hepatitis C blood test.** / For all people born from [birth year] through 1965 and any individual with known risks for hepatitis C.  Skin self-exam. / Monthly.  Influenza vaccine. / Every year.  Tetanus, diphtheria, and acellular pertussis (Tdap/Td) vaccine.** / Consult your health care provider. Pregnant women should receive 1 dose of Tdap vaccine during each pregnancy. 1 dose of Td every 10 years.  Varicella vaccine.** / Consult your health care provider. Pregnant females who do not have evidence of immunity should receive the first dose after pregnancy.  Zoster vaccine.** / 1 dose for adults aged 73 years or older.  Measles, mumps, rubella (MMR) vaccine.** / You need at least 1 dose of MMR if you were born in 1957 or later. You may also need a second dose. For females of childbearing age, rubella immunity should be determined. If there is no evidence of immunity, females who are not pregnant should be vaccinated. If there is no evidence of immunity, females who are pregnant should delay immunization until after pregnancy.  Pneumococcal 13-valent conjugate (PCV13) vaccine.** / Consult your health care provider.  Pneumococcal polysaccharide (PPSV23) vaccine.** / 1 to 2 doses if you smoke cigarettes or if you have certain conditions.  Meningococcal vaccine.** /  Consult your health care provider.  Hepatitis A vaccine.** / Consult your health care provider.  Hepatitis B vaccine.** / Consult your health care provider.  Haemophilus influenzae type b (Hib) vaccine.** / Consult your health care provider. Ages 80 years and over  Blood pressure check.** / Every year.  Lipid and cholesterol check.** / Every 5 years beginning at age 62 years.  Lung cancer  screening. / Every year if you are aged 32-80 years and have a 30-pack-year history of smoking and currently smoke or have quit within the past 15 years. Yearly screening is stopped once you have quit smoking for at least 15 years or develop a health problem that would prevent you from having lung cancer treatment.  Clinical breast exam.** / Every year after age 61 years.  BRCA-related cancer risk assessment.** / For women who have family members with a BRCA-related cancer (breast, ovarian, tubal, or peritoneal cancers).  Mammogram.** / Every year beginning at age 39 years and continuing for as long as you are in good health. Consult with your health care provider.  Pap test.** / Every 3 years starting at age 85 years through age 74 or 72 years with 3 consecutive normal Pap tests. Testing can be stopped between 65 and 70 years with 3 consecutive normal Pap tests and no abnormal Pap or HPV tests in the past 10 years.  HPV screening.** / Every 3 years from ages 55 years through ages 67 or 77 years with a history of 3 consecutive normal Pap tests. Testing can be stopped between 65 and 70 years with 3 consecutive normal Pap tests and no abnormal Pap or HPV tests in the past 10 years.  Fecal occult blood test (FOBT) of stool. / Every year beginning at age 81 years and continuing until age 22 years. You may not need to do this test if you get a colonoscopy every 10 years.  Flexible sigmoidoscopy or colonoscopy.** / Every 5 years for a flexible sigmoidoscopy or every 10 years for a colonoscopy beginning at age 67 years and continuing until age 22 years.  Hepatitis C blood test.** / For all people born from [birth year] through 1965 and any individual with known risks for hepatitis C.  Osteoporosis screening.** / A one-time screening for women ages 8 years and over and women at risk for fractures or osteoporosis.  Skin self-exam. / Monthly.  Influenza vaccine. / Every year.  Tetanus, diphtheria, and  acellular pertussis (Tdap/Td) vaccine.** / 1 dose of Td every 10 years.  Varicella vaccine.** / Consult your health care provider.  Zoster vaccine.** / 1 dose for adults aged 56 years or older.  Pneumococcal 13-valent conjugate (PCV13) vaccine.** / Consult your health care provider.  Pneumococcal polysaccharide (PPSV23) vaccine.** / 1 dose for all adults aged 15 years and older.  Meningococcal vaccine.** / Consult your health care provider.  Hepatitis A vaccine.** / Consult your health care provider.  Hepatitis B vaccine.** / Consult your health care provider.  Haemophilus influenzae type b (Hib) vaccine.** / Consult your health care provider. ** Family history and personal history of risk and conditions may change your health care provider's recommendations.   This information is not intended to replace advice given to you by your health care provider. Make sure you discuss any questions you have with your health care provider.   Document Released: 09/25/2001 Document Revised: 08/20/2014 Document Reviewed: 12/25/2010 Elsevier Interactive Patient Education [employer].

## 2016-02-23 NOTE — Progress Notes (Signed)
GYNECOLOGY CLINIC ANNUAL PREVENTATIVE CARE ENCOUNTER NOTE  Subjective:   [name] is a 35 y.o. [phone] female here for a routine annual gynecologic exam.  Current complaints: none. Desires STI screen.  Denies abnormal vaginal bleeding, discharge, pelvic pain, problems with intercourse or other gynecologic concerns.    Gynecologic History Patient's last menstrual period was 02/04/2016. Contraception: tubal ligation Last Pap: 08/2013. Results were: normal  Obstetric History OB History  Gravida Para Term Preterm AB SAB TAB Ectopic Multiple Living  4 4 4       4     # Outcome Date GA Lbr Len/2nd Weight Sex Delivery Anes PTL Lv  4 Term 08/01/13 [id number]  7 lb 15.3 oz (3.61 kg) M CS-LTranv EPI  Y  3 Term           2 Term           1 Term               Past Medical History  Diagnosis Date  . Abnormal Pap smear 11/2010  . Anemia     Past Surgical History  Procedure Laterality Date  . Wisdom tooth extraction    . Cesarean section N/A 08/01/2013    Procedure: CESAREAN SECTION;  Surgeon: Dorien Chihuahuaara J. Richardson Doppole, MD;  Location: [facility];  Service: Obstetrics;  Laterality: N/A;    Current Outpatient Prescriptions on File Prior to Visit  Medication Sig Dispense Refill  . cyclobenzaprine (FLEXERIL) 10 MG tablet Take 1 tablet (10 mg total) by mouth 2 (two) times daily as needed for muscle spasms. (Patient not taking: Reported on 02/23/2016) 20 tablet 0  . ferrous sulfate 325 (65 FE) MG tablet Take 325 mg by mouth daily with breakfast. Reported on 02/23/2016    . ibuprofen (ADVIL,MOTRIN) 600 MG tablet Take 1 tablet (600 mg total) by mouth every 6 (six) hours as needed. (Patient not taking: Reported on 02/23/2016) 30 tablet 0  . loratadine (CLARITIN) 10 MG tablet Take 10 mg by mouth daily. Reported on 02/23/2016    . oxyCODONE-acetaminophen (PERCOCET/ROXICET) 5-325 MG per tablet Take 1-2 tablets by mouth every 4 (four) hours as needed for severe pain (moderate - severe pain). (Patient not taking:  Reported on 02/23/2016) 30 tablet 0  . Prenatal Vit-Fe Fumarate-FA (PRENATAL MULTIVITAMIN) TABS tablet Take 1 tablet by mouth at bedtime. Reported on 02/23/2016     No current facility-administered medications on file prior to visit.    No Known Allergies  Social History   Social History  . Marital Status: Single    Spouse Name: N/A  . Number of Children: N/A  . Years of Education: N/A   Occupational History  . Not on file.   Social History Main Topics  . Smoking status: Never Smoker   . Smokeless tobacco: Never Used  . Alcohol Use: No  . Drug Use: No  . Sexual Activity:    Partners: Male    Birth Control/ Protection: None     Comment: Mirena inserted 03/2009   Other Topics Concern  . Not on file   Social History Narrative    Family History  Problem Relation Age of Onset  . Asthma Mother   . Cancer Maternal Grandmother     colon  . Hypertension Neg Hx   . Stroke Neg Hx   . Hyperlipidemia Neg Hx     The following portions of the patient's history were reviewed and updated as appropriate: allergies, current medications, past family history, past medical  history, past social history, past surgical history and problem list.  Review of Systems Pertinent items noted in HPI and remainder of comprehensive ROS otherwise negative.   Objective:  BP 101/72 mmHg  Pulse 85  Ht  (1.626 m)  Wt 129 lb (58.514 kg)  BMI 22.13 kg/m2  LMP 02/04/2016 CONSTITUTIONAL: Well-developed, well-nourished female in no acute distress.  HENT:  Normocephalic, atraumatic, External right and left ear normal. Oropharynx is clear and moist EYES: Conjunctivae and EOM are normal. Pupils are equal, round, and reactive to light. No scleral icterus.  NECK: Normal range of motion, supple, no masses.  Normal thyroid.  SKIN: Skin is warm and dry. No rash noted. Not diaphoretic. No erythema. No pallor. NEUROLOGIC: Alert and oriented to person, place, and time. Normal reflexes, muscle tone  coordination. No cranial nerve deficit noted. PSYCHIATRIC: Normal mood and affect. Normal behavior. Normal judgment and thought content. CARDIOVASCULAR: Normal heart rate noted, regular rhythm RESPIRATORY: Clear to auscultation bilaterally. Effort and breath sounds normal, no problems with respiration noted. BREASTS: Symmetric in size. No masses, skin changes, nipple drainage, or lymphadenopathy. ABDOMEN: Soft, normal bowel sounds, no distention noted.  No tenderness, rebound or guarding.  PELVIC: Normal appearing external genitalia; normal appearing vaginal mucosa and cervix.  No abnormal discharge noted.  Pap smear and STI screen sample obtained.  Normal uterine size, no other palpable masses, no uterine or adnexal tenderness. MUSCULOSKELETAL: Normal range of motion. No tenderness.  No cyanosis, clubbing, or edema.  2+ distal pulses.   Assessment:  Annual gynecologic examination with pap smear   Plan:  Will follow up results of pap smear and STI screen  and manage accordingly. Routine preventative health maintenance measures emphasized. Please refer to After Visit Summary for other counseling recommendations.    Jaynie Collins, MD, FACOG Attending Obstetrician & Gynecologist, [facility]

## 2016-02-24 LAB — HEPATITIS B SURFACE ANTIGEN: HEP B S AG: NEGATIVE

## 2016-02-24 LAB — RPR: RPR Ser Ql: NONREACTIVE

## 2016-02-24 LAB — HEPATITIS C ANTIBODY: Hep C Virus Ab: <0.1 s/co ratio (ref 0.0–0.9)

## 2016-02-24 LAB — HIV ANTIBODY (ROUTINE TESTING W REFLEX): HIV Screen 4th Generation wRfx: NONREACTIVE

## 2016-02-26 LAB — CHLAMYDIA/GONOCOCCUS/TRICHOMONAS, NAA
Chlamydia by NAA: NEGATIVE
Gonococcus by NAA: NEGATIVE
Trich vag by NAA: NEGATIVE

## 2016-03-01 ENCOUNTER — Encounter: Payer: Self-pay | Admitting: Obstetrics & Gynecology

## 2016-03-01 LAB — IGP, APTIMA HPV, RFX 16/18,45
HPV APTIMA: POSITIVE — AB
HPV GENOTYPE 16: NEGATIVE
HPV GENOTYPE 18,45: NEGATIVE
PAP SMEAR COMMENT: 0

## 2021-04-02 ENCOUNTER — Other Ambulatory Visit: Payer: Self-pay

## 2021-04-02 ENCOUNTER — Encounter (HOSPITAL_BASED_OUTPATIENT_CLINIC_OR_DEPARTMENT_OTHER): Payer: Self-pay | Admitting: Obstetrics and Gynecology

## 2021-04-02 ENCOUNTER — Emergency Department (HOSPITAL_BASED_OUTPATIENT_CLINIC_OR_DEPARTMENT_OTHER)
Admission: EM | Admit: 2021-04-02 | Discharge: 2021-04-02 | Disposition: A | Discharge status: Home / Self Care | Payer: Medicaid Other | Attending: Emergency Medicine | Admitting: Emergency Medicine

## 2021-04-02 DIAGNOSIS — M545 Low back pain, unspecified: Secondary | ICD-10-CM

## 2021-04-02 LAB — URINALYSIS, ROUTINE W REFLEX MICROSCOPIC
Bilirubin Urine: NEGATIVE
Glucose, UA: NEGATIVE mg/dL
Hgb urine dipstick: NEGATIVE
Ketones, ur: NEGATIVE mg/dL
Leukocytes,Ua: NEGATIVE
Nitrite: NEGATIVE
Specific Gravity, Urine: 1.029 (ref 1.005–1.030)
pH: 6.5 (ref 5.0–8.0)

## 2021-04-02 LAB — PREGNANCY, URINE: Preg Test, Ur: NEGATIVE

## 2021-04-02 MED ORDER — LIDOCAINE 5 % EX PTCH
1.0000 | MEDICATED_PATCH | CUTANEOUS | Status: DC
  Administered 2021-04-02: 1 via TRANSDERMAL
  Filled 2021-04-02: qty 1

## 2021-04-02 MED ORDER — CYCLOBENZAPRINE HCL 10 MG PO TABS: 10.0000 mg | ORAL_TABLET | Freq: Three times a day (TID) | ORAL | 0 refills | Status: AC | PRN

## 2021-04-02 MED ORDER — LIDOCAINE 5 % EX PTCH: 1.0000 | MEDICATED_PATCH | CUTANEOUS | 0 refills | Status: AC

## 2021-04-02 NOTE — ED Provider Notes (Signed)
[facility] EMERGENCY DEPT Provider Note   CSN: [id number] Arrival date & time: 04/02/21  1238     History Chief Complaint  Patient presents with   Back Pain    [name] is a 40 y.o. female.  HPI  40 year old female presents the emergency department concern for lower back pain.  Patient states that this started last night around 9 PM, was gradual.  Denies any acute injury to the area.  She states that the pain feels like a tightening cramping sensation in the lower back, worse on the right.  No radiation to the abdomen/flank.  Pain does not radiate down her legs.  No numbness/tingling.  States that laying flat is what makes the discomfort feel the best.  Took 1 dose of Aleve last night without improvement.  Past Medical History:  Diagnosis Date   Abnormal Pap smear 11/2010   Anemia     Patient Active Problem List   Diagnosis Date Noted   Normal pap smear, but positive HRHPV (negative 16, 18/45) on 02/23/16 02/23/2016    Past Surgical History:  Procedure Laterality Date   CESAREAN SECTION N/A 08/01/2013   Procedure: CESAREAN SECTION;  Surgeon: Dorien Chihuahua. Richardson Dopp, MD;  Location: [facility];  Service: Obstetrics;  Laterality: N/A;   WISDOM TOOTH EXTRACTION       OB History     Gravida  4   Para  4   Term  4   Preterm      AB      Living  4      SAB      IAB      Ectopic      Multiple      Live Births  1           Family History  Problem Relation Age of Onset   Asthma Mother    Cancer Maternal Grandmother        colon   Hypertension Neg Hx    Stroke Neg Hx    Hyperlipidemia Neg Hx     Social History   Tobacco Use   Smoking status: Never    Passive exposure: Never   Smokeless tobacco: Never  Vaping Use   Vaping Use: Never used  Substance Use Topics   Alcohol use: Yes    Comment: Social   Drug use: No    Home Medications Prior to Admission medications   Medication Sig Start Date End Date Taking? Authorizing Provider   cyclobenzaprine (FLEXERIL) 10 MG tablet Take 1 tablet (10 mg total) by mouth 2 (two) times daily as needed for muscle spasms. Patient not taking: Reported on 02/23/2016 12/15/14   Lurene Shadow, PA-C  ferrous sulfate 325 (65 FE) MG tablet Take 325 mg by mouth daily with breakfast. Reported on 02/23/2016    [provider]  ibuprofen (ADVIL,MOTRIN) 600 MG tablet Take 1 tablet (600 mg total) by mouth every 6 (six) hours as needed. Patient not taking: Reported on 02/23/2016 12/15/14   Lurene Shadow, PA-C  loratadine (CLARITIN) 10 MG tablet Take 10 mg by mouth daily. Reported on 02/23/2016    [provider]  oxyCODONE-acetaminophen (PERCOCET/ROXICET) 5-325 MG per tablet Take 1-2 tablets by mouth every 4 (four) hours as needed for severe pain (moderate - severe pain). Patient not taking: Reported on 02/23/2016 08/04/13   Haroldine Laws, CNM  Prenatal Vit-Fe Fumarate-FA (PRENATAL MULTIVITAMIN) TABS tablet Take 1 tablet by mouth at bedtime. Reported on 02/23/2016    [provider]    Allergies    Patient has no known allergies.  Review of Systems   Review of Systems  Constitutional:  Negative for chills and fever.  HENT:  Negative for congestion.   Eyes:  Negative for visual disturbance.  Respiratory:  Negative for shortness of breath.   Cardiovascular:  Negative for chest pain.  Gastrointestinal:  Negative for abdominal pain, diarrhea and vomiting.  Genitourinary:  Negative for dysuria.  Musculoskeletal:  Positive for back pain.  Skin:  Negative for rash.  Neurological:  Negative for headaches.   Physical Exam Updated Vital Signs BP 120/83   Pulse 81   Temp 98.9 F (37.2 C) (Oral)   Resp 15   Ht 5\' 4"  (1.626 m)   Wt 71.2 kg   LMP 03/12/2021 (Exact Date)   SpO2 99%   Breastfeeding No   BMI 26.95 kg/m   Physical Exam Vitals and nursing note reviewed.  Constitutional:      General: She is not in acute distress.    Appearance: Normal appearance.  HENT:      Head: Normocephalic.     Mouth/Throat:     Mouth: Mucous membranes are moist.  Cardiovascular:     Rate and Rhythm: Normal rate.  Pulmonary:     Effort: Pulmonary effort is normal. No respiratory distress.  Abdominal:     Palpations: Abdomen is soft.     Tenderness: There is no abdominal tenderness.  Musculoskeletal:     Comments: Tenderness to palpation of the lower back, worse on the right, mild muscle spasm noted, patient is otherwise neuro intact in the lower extremities  Skin:    General: Skin is warm.  Neurological:     Mental Status: She is alert and oriented to person, place, and time. Mental status is at baseline.  Psychiatric:        Mood and Affect: Mood normal.    ED Results / Procedures / Treatments   Labs (all labs ordered are listed, but only abnormal results are displayed) Labs Reviewed  URINALYSIS, ROUTINE W REFLEX MICROSCOPIC - Abnormal; Notable for the following components:      Result Value   Protein, ur TRACE (*)    All other components within normal limits  PREGNANCY, URINE    EKG None  Radiology No results found.  Procedures Procedures   Medications Ordered in ED Medications  lidocaine (LIDODERM) 5 % 1 patch (1 patch Transdermal Patch Applied 04/02/21 1457)    ED Course  I have reviewed the triage vital signs and the nursing notes.  Pertinent labs & imaging results that were available during my care of the patient were reviewed by me and considered in my medical decision making (see chart for details).    MDM Rules/Calculators/A&P                           40 year old female presents emergency department with concern for lower back pain and spasm.  No acute injury noted.  No midline spinal tenderness.  She has neuro intact.  No indication for emergent imaging.  Plan to treat symptomatically and refer outpatient.  Patient at this time appears safe and stable for discharge and will be treated as an outpatient.  Discharge plan and strict  return to ED precautions discussed, patient verbalizes understanding and agreement.  Final Clinical Impression(s) / ED Diagnoses Final diagnoses:  None    Rx / DC Orders ED Discharge Orders  None        Rozelle Logan, DO 04/02/21 1501

## 2021-04-02 NOTE — ED Triage Notes (Signed)
Patient reports to the ER for lower back pain that started at 9pm last night. Patient reports it was a small pain and it has now gotten worse. Patient denies pain running down her legs. Endorses spasms. Denies loss bowel or bladder

## 2021-04-02 NOTE — ED Notes (Signed)
Pt reports sudden onset of bilateral lower back pain starting last night while sitting on the couch.  Pt states laying flat worsens pain and that pain is equal on both sides.  Pt reports walking and standing upright also worsens pain and that sitting/laying back at an incline helps relieves the pain somewhat.  Pt describes pain as "cramping spasms."  Pt denies any known injury or new/unusual physical activity.  Pt took aleve last night with no relief.

## 2024-04-21 ENCOUNTER — Other Ambulatory Visit (HOSPITAL_BASED_OUTPATIENT_CLINIC_OR_DEPARTMENT_OTHER): Payer: Self-pay

## 2024-04-21 ENCOUNTER — Other Ambulatory Visit: Payer: Self-pay

## 2024-04-21 ENCOUNTER — Emergency Department (HOSPITAL_BASED_OUTPATIENT_CLINIC_OR_DEPARTMENT_OTHER)
Admission: EM | Admit: 2024-04-21 | Discharge: 2024-04-21 | Disposition: A | Discharge status: Home / Self Care | Attending: Emergency Medicine | Admitting: Emergency Medicine

## 2024-04-21 ENCOUNTER — Encounter (HOSPITAL_BASED_OUTPATIENT_CLINIC_OR_DEPARTMENT_OTHER): Payer: Self-pay | Admitting: Emergency Medicine

## 2024-04-21 DIAGNOSIS — M25511 Pain in right shoulder: Secondary | ICD-10-CM | POA: Insufficient documentation

## 2024-04-21 DIAGNOSIS — R002 Palpitations: Secondary | ICD-10-CM | POA: Diagnosis not present

## 2024-04-21 MED ORDER — METHOCARBAMOL 500 MG PO TABS: 1000.0000 mg | ORAL_TABLET | Freq: Three times a day (TID) | ORAL | 0 refills | Status: DC | PRN

## 2024-04-21 MED ORDER — METHOCARBAMOL 500 MG PO TABS
1000.0000 mg | ORAL_TABLET | Freq: Three times a day (TID) | ORAL | 0 refills | Status: DC | PRN
  Filled 2024-04-21: qty 30, 5d supply, fill #0

## 2024-04-21 MED ORDER — MELOXICAM 7.5 MG PO TABS
7.5000 mg | ORAL_TABLET | Freq: Every day | ORAL | 0 refills | Status: DC
  Filled 2024-04-21: qty 10, 10d supply, fill #0

## 2024-04-21 MED ORDER — MELOXICAM 7.5 MG PO TABS: 7.5000 mg | ORAL_TABLET | Freq: Every day | ORAL | 0 refills | Status: AC

## 2024-04-21 MED ORDER — KETOROLAC TROMETHAMINE 15 MG/ML IJ SOLN
15.0000 mg | Freq: Once | INTRAMUSCULAR | Status: AC
  Administered 2024-04-21: 15 mg via INTRAMUSCULAR
  Filled 2024-04-21: qty 1

## 2024-04-21 NOTE — Discharge Instructions (Signed)
 Please read and follow all provided instructions.  Your diagnoses today include:  1. Acute pain of right shoulder     Tests performed today include: Vital signs. See below for your results today.   Medications prescribed:  Robaxin  (methocarbamol ) - muscle relaxer medication  DO NOT drive or perform any activities that require you to be awake and alert because this medicine can make you drowsy.   Meloxicam  - anti-inflammatory pain medication  You have been prescribed an anti-inflammatory medication or NSAID. Take with food. Do not take aspirin, ibuprofen , or naproxen if taking this medication. Take smallest effective dose for the shortest duration needed for your pain. Stop taking if you experience stomach pain or vomiting.   Take any prescribed medications only as directed.  Home care instructions:  Follow any educational materials contained in this packet Follow R.I.C.E. Protocol: R - rest your injury  I  - use ice on injury without applying directly to skin C - compress injury with bandage or splint E - elevate the injury as much as possible  Follow-up instructions: Please follow-up with your primary care provider or the provided orthopedic physician (bone specialist) if you continue to have significant pain in 1 week. In this case you may have a more severe injury that requires further care.   Return instructions:  Please return if your fingers are numb or tingling, appear gray or blue, or you have severe pain (also elevate the arm and loosen splint or wrap if you were given one) Please return to the Emergency Department if you experience worsening symptoms.  Please return if you have any other emergent concerns.  Additional Information:  Your vital signs today were: BP 106/74   Pulse 82   Temp 98 F (36.7 C) (Oral)   Resp 18   Wt 68 kg   LMP 04/01/2024   SpO2 100%   BMI 25.75 kg/m  If your blood pressure (BP) was elevated above 135/85 this visit, please have this  repeated by your doctor within one month. --------------

## 2024-04-21 NOTE — ED Provider Notes (Signed)
 [facility] AT [facility] Provider Note   CSN: [id number] Arrival date & time: 04/21/24  9163     Patient presents with: Shoulder Pain   [name] is a 43 y.o. female.   Patient with no significant past medical history presents to the emergency department today for evaluation of right shoulder pain.  Patient started having some milder achy pain in her shoulder and neck area yesterday and this progressed during the course of the day.  She thought that if she slept, it would feel better.  She had difficulty sleeping last night.  She has pain with certain movements of her right shoulder and neck.  No numbness or tingling into the right arm.  No chest pain or shortness of breath.  Patient drove herself to the emergency department today.  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  She has taken Tylenol  and ibuprofen  without improvement.       Prior to Admission medications   Medication Sig Start Date End Date Taking? Authorizing Provider  meloxicam  (MOBIC ) 7.5 MG tablet Take 1 tablet (7.5 mg total) by mouth daily. 04/21/24  Yes Vanna Shavers, PA-C  methocarbamol  (ROBAXIN ) 500 MG tablet Take 2 tablets (1,000 mg total) by mouth every 8 (eight) hours as needed for muscle spasms. 04/21/24  Yes Desiderio Chew, PA-C  ferrous sulfate  325 (65 FE) MG tablet Take 325 mg by mouth daily with breakfast. Reported on 02/23/2016    [provider]  ibuprofen  (ADVIL ,MOTRIN ) 600 MG tablet Take 1 tablet (600 mg total) by mouth every 6 (six) hours as needed. Patient not taking: Reported on 02/23/2016 12/15/14   Anitra Rocky KIDD, PA-C  lidocaine  (LIDODERM ) 5 % Place 1 patch onto the skin daily. Remove & Discard patch within 12 hours or as directed by MD 04/02/21   Horton, Kristie M, DO  loratadine (CLARITIN) 10 MG tablet Take 10 mg by mouth daily. Reported on 02/23/2016    [provider]  oxyCODONE -acetaminophen  (PERCOCET/ROXICET) 5-325 MG per tablet  Take 1-2 tablets by mouth every 4 (four) hours as needed for severe pain (moderate - severe pain). Patient not taking: Reported on 02/23/2016 08/04/13   Myron Nest, CNM  Prenatal Vit-Fe Fumarate-FA (PRENATAL MULTIVITAMIN) TABS tablet Take 1 tablet by mouth at bedtime. Reported on 02/23/2016    [provider]    Allergies: Patient has no known allergies.    Review of Systems  Updated Vital Signs BP 106/74   Pulse 82   Temp 98 F (36.7 C) (Oral)   Resp 18   Wt 68 kg   LMP 04/01/2024   SpO2 100%   BMI 25.75 kg/m   Physical Exam Vitals and nursing note reviewed.  Constitutional:      Appearance: She is well-developed.  HENT:     Head: Normocephalic and atraumatic.  Eyes:     Pupils: Pupils are equal, round, and reactive to light.  Cardiovascular:     Pulses: Normal pulses. No decreased pulses.  Musculoskeletal:        General: Tenderness present.     Right shoulder: Tenderness (Anterior/posterior) present. No swelling or bony tenderness. Decreased range of motion.     Right upper arm: No tenderness or bony tenderness.     Right elbow: Normal range of motion. No tenderness.     Right forearm: No tenderness or bony tenderness.     Right wrist: No tenderness. Normal range of motion.     Cervical back: Normal range  of motion and neck supple. Tenderness present. No bony tenderness. Normal range of motion.     Thoracic back: No tenderness. Normal range of motion.       Back:  Skin:    General: Skin is warm and dry.  Neurological:     Mental Status: She is alert.     Sensory: No sensory deficit.     Comments: Motor, sensation, and vascular distal to the injury is fully intact.   Psychiatric:        Mood and Affect: Mood normal.     (all labs ordered are listed, but only abnormal results are displayed) Labs Reviewed - No data to display  EKG: None  Radiology: No results found.   Procedures   Medications Ordered in the ED  ketorolac  (TORADOL ) 15 MG/ML  injection 15 mg (has no administration in time range)   ED Course  Patient seen and examined. History obtained directly from patient. Work-up including labs, imaging, EKG ordered in triage, if performed, were reviewed.    Labs/EKG: None ordered  Imaging: None ordered, low clinical concern for fracture or dislocation  Medications/Fluids: Ordered: IM Toradol   Most recent vital signs reviewed and are as follows: BP 106/74   Pulse 82   Temp 98 F (36.7 C) (Oral)   Resp 18   Wt 68 kg   LMP 04/01/2024   SpO2 100%   BMI 25.75 kg/m   Initial impression: Right shoulder pain, likely musculoskeletal.  Prescription written for meloxicam , Robaxin   Patient counseled on proper use of muscle relaxant medication.  They were advised not to drink alcohol, drive any vehicle, or do any dangerous activities while taking this medication.  Patient verbalized understanding.  Home treatment plan: RICE protocol, continued NSAIDs  Return instructions discussed with patient: New or worsening symptoms  Follow-up instructions discussed with patient: Orthopedics in 1 week if not improving                                  Medical Decision Making Risk Prescription drug management.   Patient with right shoulder pain, worse with movement and palpation.  Likely musculoskeletal.  Lung sounds are clear to auscultation bilaterally and patient denies chest pain.  Low concern for pneumothorax or pneumonia.  Pain does not radiate down into the arm, this seems less neuropathic in nature.  Possible differentials include shoulder sprain or strain, labrum injury, rotator cuff injury.  The patient's vital signs, pertinent lab work and imaging were reviewed and interpreted as discussed in the ED course. Hospitalization was considered for further testing, treatments, or serial exams/observation. However as patient is well-appearing, has a stable exam, and reassuring studies today, I do not feel that they warrant admission  at this time. This plan was discussed with the patient who verbalizes agreement and comfort with this plan and seems reliable and able to return to the Emergency Department with worsening or changing symptoms.      Final diagnoses:  Acute pain of right shoulder    ED Discharge Orders          Ordered    methocarbamol  (ROBAXIN ) 500 MG tablet  Every 8 hours PRN        04/21/24 0914    meloxicam  (MOBIC ) 7.5 MG tablet  Daily        04/21/24 0914               Desiderio Chew, PA-C 04/21/24  9078    Jerrol Agent, MD 04/21/24 1144

## 2024-04-21 NOTE — ED Notes (Signed)
 Aleve at 0230, Tylenol  at 0430 with no relief

## 2024-04-21 NOTE — ED Triage Notes (Signed)
 Pt c/o RT side shoulder and neck pain starting yesterday, pain continues and also posterior head today

## 2024-05-01 ENCOUNTER — Other Ambulatory Visit (HOSPITAL_BASED_OUTPATIENT_CLINIC_OR_DEPARTMENT_OTHER): Payer: Self-pay

## 2024-05-20 ENCOUNTER — Other Ambulatory Visit: Payer: Self-pay

## 2024-05-20 ENCOUNTER — Emergency Department (HOSPITAL_BASED_OUTPATIENT_CLINIC_OR_DEPARTMENT_OTHER)
Admission: EM | Admit: 2024-05-20 | Discharge: 2024-05-20 | Disposition: A | Discharge status: Home / Self Care | Attending: Emergency Medicine | Admitting: Emergency Medicine

## 2024-05-20 ENCOUNTER — Encounter (HOSPITAL_BASED_OUTPATIENT_CLINIC_OR_DEPARTMENT_OTHER): Payer: Self-pay

## 2024-05-20 ENCOUNTER — Emergency Department (HOSPITAL_BASED_OUTPATIENT_CLINIC_OR_DEPARTMENT_OTHER): Admitting: Radiology

## 2024-05-20 DIAGNOSIS — W01198A Fall on same level from slipping, tripping and stumbling with subsequent striking against other object, initial encounter: Secondary | ICD-10-CM | POA: Diagnosis not present

## 2024-05-20 DIAGNOSIS — M25511 Pain in right shoulder: Secondary | ICD-10-CM | POA: Diagnosis present

## 2024-05-20 MED ORDER — CYCLOBENZAPRINE HCL 10 MG PO TABS: 10.0000 mg | ORAL_TABLET | Freq: Two times a day (BID) | ORAL | 0 refills | Status: AC | PRN

## 2024-05-20 MED ORDER — KETOROLAC TROMETHAMINE 15 MG/ML IJ SOLN
15.0000 mg | Freq: Once | INTRAMUSCULAR | Status: AC
  Administered 2024-05-20: 15 mg via INTRAMUSCULAR
  Filled 2024-05-20: qty 1

## 2024-05-20 NOTE — ED Triage Notes (Signed)
 Pt reports R shoulder pain woke her out of her sleep yesterday morning and she heard a 'pop'. Reports later that day she was walking to the restroom, became dizzy and lightheaded and fell down, hitting R side of head on hardwood floor. Denies any dizziness currently. Reports hx of pain in same shoulder x~1 month ago.

## 2024-05-20 NOTE — ED Notes (Signed)
 Dc paperwork given and verbally understood.Colleen AasAaron Ramsey

## 2024-05-20 NOTE — Discharge Instructions (Signed)
 As we discussed, your x-ray was unremarkable today.  You may have a muscle strain or a tendon/ligament injury that did not show up on an image.  I have given you a referral to orthopedics with a number to call to schedule an appointment for follow-up management this.  In the interim, I recommend that you rest and ice your shoulder and take Tylenol /ibuprofen  as needed for pain.  I have also given you a prescription for Flexeril  which is a muscle relaxer that you can take as prescribed as needed.  Do not drive or operate heavy machinery while taking this medication as it can be sedating.  You can also use topical agents such as Voltaren gel for any residual discomfort.  Return if development of any new or worsening symptoms.

## 2024-05-20 NOTE — ED Provider Notes (Signed)
 [name] Provider Note   CSN: [id number] Arrival date & time: 05/20/24  1203     Patient presents with: Shoulder Pain and Fall   [name] is a 43 y.o. female.   Patient with no pertinent past medical history presents today with complaints of right shoulder pain.  Reports that in the night 2 nights ago [name] rolled over and felt a popping sensation in her shoulder followed by pain.  [name] then got up to go to the bathroom and had a felt lightheaded and had syncopal event.  [name] did hit hit her head and thinks [name] hit her shoulder as well.  [name] is not anticoagulated.  [name] is unsure how long [name] was on the ground for but then was able to get up and go back to bed without issue.  Since then [name] has had some discomfort in her shoulder, denies any persistent lightheadedness or syncope.  Is ever having any chest pain or shortness of breath.  No nausea or vomiting.  Denies any persistent headache or vision changes.  Reports that [name] had similar discomfort in her shoulder a month ago and was here until [name] had a muscle strain.  [name] presents requesting an x-ray.  Does report that [name] put topical pain medicine on her shoulder and had some improvement.  The history is provided by the patient. No language interpreter was used.  Shoulder Pain Fall       Prior to Admission medications   Medication Sig Start Date End Date Taking? Authorizing Provider  ferrous sulfate  325 (65 FE) MG tablet Take 325 mg by mouth daily with breakfast. Reported on 02/23/2016    [provider]  ibuprofen  (ADVIL ,MOTRIN ) 600 MG tablet Take 1 tablet (600 mg total) by mouth every 6 (six) hours as needed. Patient not taking: Reported on 02/23/2016 12/15/14   Anitra Rocky KIDD, PA-C  lidocaine  (LIDODERM ) 5 % Place 1 patch onto the skin daily. Remove & Discard patch within 12 hours or as directed by MD 04/02/21   Horton, Kristie M, DO  loratadine (CLARITIN) 10 MG tablet Take 10 mg  by mouth daily. Reported on 02/23/2016    [provider]  meloxicam  (MOBIC ) 7.5 MG tablet Take 1 tablet (7.5 mg total) by mouth daily. 04/21/24   Geiple, Joshua, PA-C  methocarbamol  (ROBAXIN ) 500 MG tablet Take 2 tablets (1,000 mg total) by mouth every 8 (eight) hours as needed for muscle spasms. 04/21/24   Geiple, Joshua, PA-C  oxyCODONE -acetaminophen  (PERCOCET/ROXICET) 5-325 MG per tablet Take 1-2 tablets by mouth every 4 (four) hours as needed for severe pain (moderate - severe pain). Patient not taking: Reported on 02/23/2016 08/04/13   Myron Nest, CNM  Prenatal Vit-Fe Fumarate-FA (PRENATAL MULTIVITAMIN) TABS tablet Take 1 tablet by mouth at bedtime. Reported on 02/23/2016    [provider]    Allergies: Patient has no known allergies.    Review of Systems  Musculoskeletal:  Positive for arthralgias and myalgias.  All other systems reviewed and are negative.   Updated Vital Signs BP 107/74   Pulse 100   Temp 98 F (36.7 C)   Resp 18   Ht 5' 4 (1.626 m)   Wt 70.3 kg   LMP 05/02/2024 (Approximate)   SpO2 100%   BMI 26.61 kg/m   Physical Exam Vitals and nursing note reviewed.  Constitutional:      General: [name] is not in acute distress.    Appearance: Normal appearance. [name]  is normal weight. [name] is not ill-appearing, toxic-appearing or diaphoretic.  HENT:     Head: Normocephalic and atraumatic.     Comments: No racoon eyes No battle sign Eyes:     Extraocular Movements: Extraocular movements intact.     Pupils: Pupils are equal, round, and reactive to light.  Cardiovascular:     Rate and Rhythm: Normal rate and regular rhythm.     Heart sounds: Normal heart sounds.     Comments: No tenderness to palpation of the anterior chest wall Pulmonary:     Effort: Pulmonary effort is normal. No respiratory distress.     Breath sounds: Normal breath sounds.  Abdominal:     General: Abdomen is flat.     Palpations: Abdomen is soft.     Tenderness: There is no  abdominal tenderness.     Comments: No abdominal tenderness or bruising  Musculoskeletal:        General: Normal range of motion.     Cervical back: Normal, normal range of motion and neck supple.     Thoracic back: Normal.     Lumbar back: Normal.     Comments: No tenderness noted to palpation of the cervical, thoracic, or lumbar spine.  No focal bony tenderness noted to the right shoulder. Does have palpable muscle tightness and tenderness noted to the right trapezius area. No crepitus, bruising, or deformity.  Radial pulse intact and 2+. ROM intact with some discomfort  Skin:    General: Skin is warm and dry.  Neurological:     General: No focal deficit present.     Mental Status: [name] is alert and oriented to person, place, and time.  Psychiatric:        Mood and Affect: Mood normal.        Behavior: Behavior normal.     (all labs ordered are listed, but only abnormal results are displayed) Labs Reviewed - No data to display  EKG: EKG Interpretation Date/Time:  Wednesday May 20 2024 12:41:57 EDT Ventricular Rate:  78 PR Interval:  124 QRS Duration:  90 QT Interval:  372 QTC Calculation: 424 R Axis:   90  Text Interpretation: Sinus rhythm Borderline right axis deviation Artifact in lead(s) I III aVL No old tracing to compare Confirmed by Towana Sharper [phone]) on 05/20/2024 12:46:04 PM  Radiology: DG Shoulder Right Result Date: 05/20/2024 CLINICAL DATA:  Right shoulder pain.  Popping sensation. EXAM: RIGHT SHOULDER - 2+ VIEW COMPARISON:  None Available. FINDINGS: No acute osseous or joint abnormality. Visualized right chest is grossly unremarkable. IMPRESSION: No acute findings. Electronically Signed   By: Newell Eke M.D.   On: 05/20/2024 13:51     Procedures   Medications Ordered in the ED  ketorolac  (TORADOL ) 15 MG/ML injection 15 mg (15 mg Intramuscular Given 05/20/24 1301)                                    Medical Decision Making Amount and/or  Complexity of Data Reviewed Radiology: ordered.  Risk Prescription drug management.   This patient is a 43 y.o. female who presents to the ED for concern of shoulder injury, this involves an extensive number of treatment options, and is a complaint that carries with it a high risk of complications and morbidity. The emergent differential diagnosis prior to evaluation includes, but is not limited to,  fracture, dislocation, muscle strain . This is not an  exhaustive differential.   Past Medical History / Co-morbidities / Social History:  has a past medical history of Abnormal Pap smear (11/2010) and Anemia.  Additional history: Chart reviewed.  Seen on 9/9 for similar sounding complaints, right shoulder pain.  Attributed to muscular cause of symptoms, no imaging was ordered.  [name] was given Robaxin  and meloxicam  and discharged.  Physical Exam: Physical exam performed. The pertinent findings include:   No tenderness noted to palpation of the cervical, thoracic, or lumbar spine.  No focal bony tenderness noted to the right shoulder. Does have palpable muscle tightness and tenderness noted to the right trapezius area. No crepitus, bruising, or deformity.  Radial pulse intact and 2+. ROM intact with some discomfort  Lab Tests: I considered ordering labs to evaluate patients syncopal event, however patient clarifies that [name] is only here for her shoulder pain and does not want to have labs drawn.   Imaging Studies: I ordered imaging studies including Dg right shoulder. I independently visualized and interpreted imaging which showed no acute findings. I agree with the radiologist interpretation.   Cardiac Monitoring:  The patient was maintained on a cardiac monitor.  My attending physician Dr. Towana viewed and interpreted the cardiac monitored which showed an underlying rhythm of: sinus rhythm, no STEMI. I agree with this interpretation.   Medications: I ordered medication including toradol    for shoulder pain. Reevaluation of the patient after these medicines showed that the patient improved. I have reviewed the patients home medicines and have made adjustments as needed.   Disposition: After consideration of the diagnostic results and the patients response to treatment, I feel that emergency department workup does not suggest an emergent condition requiring admission or immediate intervention beyond what has been performed at this time. The plan is: Discharge with Flexeril , close outpatient follow-up and return precautions.  Will give her a referral to orthopedics to follow-up with as well.  [name] reports that the Robaxin  [name] was given previously did help but the medicine was too large so [name] stopped taking it.  Therefore we will switch to Flexeril .  Advised not to drive or operate heavy machinery while taking this medication due to sedating properties.  Additionally as already stated, patient syncopal event, her EKG is nonischemic and [name] is not having any symptoms in the last 24 hours.  Shared decision making, patient would prefer to not have additional evaluation regarding this which is reasonable.  Return precautions and outpatient follow-up recommended and discussed as well. Evaluation and diagnostic testing in the emergency department does not suggest an emergent condition requiring admission or immediate intervention beyond what has been performed at this time.  Plan for discharge with close PCP follow-up.  Patient is understanding and amenable with plan, educated on red flag symptoms that would prompt immediate return.  Patient discharged in stable condition.  Findings and plan of care discussed with supervising physician Dr. Towana who is in agreement.   Final diagnoses:  Acute pain of right shoulder    ED Discharge Orders          Ordered    cyclobenzaprine  (FLEXERIL ) 10 MG tablet  2 times daily PRN        05/20/24 1459          An After Visit Summary was printed and given  to the patient.      Favor Hackler A, PA-C 05/20/24 1501    Towana Ozell BROCKS, MD 05/20/24 [phone]

## 2024-05-20 NOTE — ED Notes (Signed)
 Toradol  cleared by the Provider and the Pt stated that she was not Preg.
